# Patient Record
Sex: Female | Born: 1980 | Hispanic: Yes | Marital: Single | Smoking: Never smoker
Health system: Southern US, Community
[De-identification: ages and names within clinical notes are randomized; demographics above are authoritative.]

## PROBLEM LIST (undated history)

## (undated) DIAGNOSIS — F32A Depression, unspecified: Secondary | ICD-10-CM

## (undated) DIAGNOSIS — D649 Anemia, unspecified: Secondary | ICD-10-CM

## (undated) DIAGNOSIS — F909 Attention-deficit hyperactivity disorder, unspecified type: Secondary | ICD-10-CM

## (undated) DIAGNOSIS — N200 Calculus of kidney: Secondary | ICD-10-CM

## (undated) HISTORY — PX: ABDOMINAL HYSTERECTOMY: SHX81

---

## 2015-02-27 ENCOUNTER — Emergency Department (HOSPITAL_BASED_OUTPATIENT_CLINIC_OR_DEPARTMENT_OTHER): Payer: Medicaid Other

## 2015-02-27 ENCOUNTER — Emergency Department (HOSPITAL_BASED_OUTPATIENT_CLINIC_OR_DEPARTMENT_OTHER)
Admission: EM | Admit: 2015-02-27 | Discharge: 2015-02-27 | Disposition: A | Discharge status: Home / Self Care | Payer: Medicaid Other | Attending: Emergency Medicine | Admitting: Emergency Medicine

## 2015-02-27 ENCOUNTER — Encounter (HOSPITAL_BASED_OUTPATIENT_CLINIC_OR_DEPARTMENT_OTHER): Payer: Self-pay | Admitting: *Deleted

## 2015-02-27 DIAGNOSIS — W1839XA Other fall on same level, initial encounter: Secondary | ICD-10-CM | POA: Diagnosis not present

## 2015-02-27 DIAGNOSIS — Y998 Other external cause status: Secondary | ICD-10-CM | POA: Insufficient documentation

## 2015-02-27 DIAGNOSIS — Y9301 Activity, walking, marching and hiking: Secondary | ICD-10-CM | POA: Diagnosis not present

## 2015-02-27 DIAGNOSIS — S80211A Abrasion, right knee, initial encounter: Secondary | ICD-10-CM | POA: Diagnosis not present

## 2015-02-27 DIAGNOSIS — S99911A Unspecified injury of right ankle, initial encounter: Secondary | ICD-10-CM | POA: Diagnosis present

## 2015-02-27 DIAGNOSIS — S8261XA Displaced fracture of lateral malleolus of right fibula, initial encounter for closed fracture: Secondary | ICD-10-CM | POA: Insufficient documentation

## 2015-02-27 DIAGNOSIS — Z87448 Personal history of other diseases of urinary system: Secondary | ICD-10-CM | POA: Diagnosis not present

## 2015-02-27 DIAGNOSIS — Y929 Unspecified place or not applicable: Secondary | ICD-10-CM | POA: Diagnosis not present

## 2015-02-27 DIAGNOSIS — S82891A Other fracture of right lower leg, initial encounter for closed fracture: Secondary | ICD-10-CM

## 2015-02-27 HISTORY — DX: Calculus of kidney: N20.0

## 2015-02-27 MED ORDER — HYDROCODONE-ACETAMINOPHEN 5-325 MG PO TABS
2.0000 | ORAL_TABLET | Freq: Once | ORAL | Status: DC
  Filled 2015-02-27: qty 2

## 2015-02-27 MED ORDER — HYDROCODONE-ACETAMINOPHEN 5-325 MG PO TABS: 1.0000 | ORAL_TABLET | ORAL | Status: DC | PRN

## 2015-02-27 NOTE — ED Provider Notes (Signed)
CSN: [id number]     Arrival date & time 02/27/15  1324 History   First MD Initiated Contact with Patient 02/27/15 1001     Chief Complaint  Patient presents with  . Ankle Pain     (Consider location/radiation/quality/duration/timing/severity/associated sxs/prior Treatment) HPI Complains of right ankle pain worse at  lateral malleolus onset 1 AM today after she forcibly inverted her right ankle and fell while walking 1 AM today no other injury no treatment prior to coming here pain is worse with weightbearing improved with nonweightbearing. No other associated symptoms Past Medical History  Diagnosis Date  . Kidney stones    Past Surgical History  Procedure Laterality Date  . Abdominal hysterectomy     No family history on file. History  Substance Use Topics  . Smoking status: Not on file  . Smokeless tobacco: Not on file  . Alcohol Use: No   occasional alcohol no drugs nonsmoker OB History    No data available     Review of Systems  Constitutional: Negative.   Musculoskeletal: Positive for arthralgias and gait problem.       Pain with weightbearing on right lower extremity. Right ankle pain  Skin: Positive for wound.       Abrasion to right knee  Allergic/Immunologic: Negative.        Up to date on tetanus immunization      Allergies  Review of patient's allergies indicates no known allergies.  Home Medications   Prior to Admission medications   Medication Sig Start Date End Date Taking? Authorizing Provider  tamsulosin (FLOMAX) 0.4 MG CAPS capsule Take 0.4 mg by mouth.   Yes Historical Provider, MD   BP 126/89 mmHg  Pulse 95  Temp(Src) 98.4 F (36.9 C) (Oral)  Resp 16  Ht  (1.549 m)  Wt 180 lb (81.647 kg)  BMI 34.03 kg/m2  SpO2 99% Physical Exam  Constitutional: She appears well-developed and well-nourished. No distress.  HENT:  Head: Normocephalic and atraumatic.  Eyes: EOM are normal.  Neck: Neck supple.  Cardiovascular: Normal rate.    Pulmonary/Chest: Effort normal.  Abdominal: She exhibits no distension.  Musculoskeletal: She exhibits no edema.  Right lower extremity dime-sized abrasion overlying patella. Ankle is swollen and tender over lateral malleolus. Skin intact. DP pulse 2+. Negative Thompson's test. She is nontender over proximal fibula. All other extremity is a contusion abrasion or tenderness neurovascularly intact  Neurological: She is alert. No cranial nerve deficit. Coordination normal.  Skin: Skin is warm and dry. No rash noted.  Psychiatric: She has a normal mood and affect.  Nursing note and vitals reviewed.   ED Course  Procedures (including critical care time) Labs Review Labs Reviewed - No data to display  Imaging Review No results found.   EKG Interpretation None     X-ray viewed by me. No results found for this or any previous visit. Dg Ankle Complete Right  02/27/2015   CLINICAL DATA:  34 year old female with history of trauma from a fall complaining of right ankle pain.  EXAM: RIGHT ANKLE - COMPLETE 3+ VIEW  COMPARISON:  No priors.  FINDINGS: Three views of the right ankle demonstrate an oblique fracture through the lateral malleolus, which appears minimally displaced (approximately 2 mm posteriorly). Overlying soft tissue swelling. Distal tibia appears intact. Mild widening of the lateral ankle mortise.  IMPRESSION: 1. Acute minimally displaced oblique fracture of the lateral malleolus with mild widening of the lateral ankle mortise, as above.   Electronically Signed  By: Trudie Reed M.D.   On: 02/27/2015 10:33    Posterior short-leg splint applied by technician iscomfortable for patient.  MDM  Plan prescription Norco, crutches. Orthopedic referral Dr.Norris Diagnosis closed fracture of right ankle Final diagnoses:  None        Doug Sou, MD 02/27/15 1102

## 2015-02-27 NOTE — ED Notes (Signed)
Patient transported to X-ray 

## 2015-02-27 NOTE — Discharge Instructions (Signed)
Ankle Fracture Take Tylenol for mild pain or the pain medicine prescribed for bad pain. Don't take Tylenol or get it with the pain medicine prescribed as the combination can be dangerous. Call Dr.Norris's office in 2 days to schedule an appointment for within the next week. Tell office staff that you were seen here and had x-rays performed here. A fracture is a break in a bone. A cast or splint may be used to protect the ankle and heal the break. Sometimes, surgery is needed. HOME CARE  Use crutches as told by your doctor. It is very important that you use your crutches correctly.  Do not put weight or pressure on the injured ankle until told by your doctor.  Keep your ankle raised (elevated) when sitting or lying down.  Apply ice to the ankle:  Put ice in a plastic bag.  Place a towel between your cast and the bag.  Leave the ice on for 20 minutes, 2-3 times a day.  If you have a plaster or fiberglass cast:  Do not try to scratch under the cast with any objects.  Check the skin around the cast every day. You may put lotion on red or sore areas.  Keep your cast dry and clean.  If you have a plaster splint:  Wear the splint as told by your doctor.  You can loosen the elastic around the splint if your toes get numb, tingle, or turn cold or blue.  Do not put pressure on any part of your cast or splint. It may break. Rest your plaster splint or cast only on a pillow the first 24 hours until it is fully hardened.  Cover your cast or splint with a plastic bag during showers.  Do not lower your cast or splint into water.  Take medicine as told by your doctor.  Do not drive until your doctor says it is safe.  Follow-up with your doctor as told. It is very important that you go to your follow-up visits. GET HELP IF: The swelling and discomfort gets worse.  GET HELP RIGHT AWAY IF:   Your splint or cast breaks.  You continue to have very bad pain.  You have new pain or  swelling after your splint or cast was put on.  Your skin or toes below the injured ankle:  Turn blue or gray.  Feel cold, numb, or you cannot feel them.  There is a bad smell or yellowish white fluid (pus) coming from under the splint or cast. MAKE SURE YOU:   Understand these instructions.  Will watch your condition.  Will get help right away if you are not doing well or get worse. Document Released: 05/07/2009 Document Revised: 04/30/2013 Document Reviewed: 02/06/2013 [facility] Patient Information 2015 [place], [place]. This information is not intended to replace advice given to you by your health care provider. Make sure you discuss any questions you have with your health care provider.

## 2015-02-27 NOTE — ED Notes (Signed)
Pt c/o right ankle injury x 10 hrs ago, fall while walking

## 2015-02-27 NOTE — ED Notes (Signed)
MD at bedside. 

## 2015-03-11 ENCOUNTER — Encounter (HOSPITAL_COMMUNITY): Payer: Self-pay | Admitting: *Deleted

## 2015-03-11 MED ORDER — CHLORHEXIDINE GLUCONATE 4 % EX LIQD: 60.0000 mL | Freq: Once | CUTANEOUS | Status: DC

## 2015-03-11 MED ORDER — MUPIROCIN 2 % EX OINT
1.0000 "application " | TOPICAL_OINTMENT | Freq: Once | CUTANEOUS | Status: AC
  Administered 2015-03-12: 1 via TOPICAL
  Filled 2015-03-11: qty 22

## 2015-03-11 MED ORDER — CEFAZOLIN SODIUM-DEXTROSE 2-3 GM-% IV SOLR
2.0000 g | INTRAVENOUS | Status: AC
  Administered 2015-03-12: 2 g via INTRAVENOUS
  Filled 2015-03-11 (×2): qty 50

## 2015-03-11 NOTE — H&P (Signed)
  Dawn Morton is an 34 y.o. female.    Chief Complaint: right ankle pain  HPI: Pt is a 34 y.o. female complaining of right ankle pain since an injury about 2 weeks ago. Pt slipped and fell while dancing injuring right ankle. Seen in the office for cast placement but unable to tolerate the cast. Pain had continually increased since the beginning. X-rays in the clinic show right fibula fracture with mild displacement. Various options are discussed with the patient. Risks, benefits and expectations were discussed with the patient. Patient understand the risks, benefits and expectations and wishes to proceed with surgery.   PCP:  No primary care provider on file.  D/C Plans: Home  PMH: No past medical history on file.  PSH: No past surgical history on file.  Social History:  has no tobacco, alcohol, and drug history on file.  Allergies:  Allergies not on file  Medications: No current facility-administered medications for this encounter.   No current outpatient prescriptions on file.    No results found for this or any previous visit (from the past 48 hour(s)). No results found.  ROS: Pain with rom of the right lower extremity  Physical Exam:  Alert and oriented 34 y.o. female in no acute distress Cranial nerves 2-12 intact Cervical spine: full rom with no tenderness, nv intact distally Chest: active breath sounds bilaterally, no wheeze rhonchi or rales Heart: regular rate and rhythm, no murmur Abd: non tender non distended with active bowel sounds Hip is stable with rom  Right ankle mild edema nv intact distally Pain with rom and pt keeps it plantar flexed  Assessment/Plan Assessment: right fibula fracture  Plan: Patient will undergo a right ankle ORIF by Dr. Ranell Patrick at [facility]. Risks benefits and expectations were discussed with the patient. Patient understand risks, benefits and expectations and wishes to proceed.

## 2015-03-12 ENCOUNTER — Encounter (HOSPITAL_COMMUNITY)
Admission: RE | Disposition: A | Discharge status: Home / Self Care | Payer: Self-pay | Source: Ambulatory Visit | Attending: Orthopedic Surgery

## 2015-03-12 ENCOUNTER — Ambulatory Visit (HOSPITAL_COMMUNITY): Payer: Medicaid Other | Admitting: Anesthesiology

## 2015-03-12 ENCOUNTER — Ambulatory Visit (HOSPITAL_COMMUNITY)
Admission: RE | Admit: 2015-03-12 | Discharge: 2015-03-12 | Disposition: A | Discharge status: Home / Self Care | Payer: Medicaid Other | Source: Ambulatory Visit | Attending: Orthopedic Surgery | Admitting: Orthopedic Surgery

## 2015-03-12 ENCOUNTER — Encounter (HOSPITAL_COMMUNITY): Payer: Self-pay | Admitting: *Deleted

## 2015-03-12 DIAGNOSIS — S82831A Other fracture of upper and lower end of right fibula, initial encounter for closed fracture: Secondary | ICD-10-CM | POA: Insufficient documentation

## 2015-03-12 DIAGNOSIS — Y9341 Activity, dancing: Secondary | ICD-10-CM | POA: Diagnosis not present

## 2015-03-12 DIAGNOSIS — W010XXA Fall on same level from slipping, tripping and stumbling without subsequent striking against object, initial encounter: Secondary | ICD-10-CM | POA: Diagnosis not present

## 2015-03-12 HISTORY — PX: ORIF FIBULA FRACTURE: SHX5114

## 2015-03-12 HISTORY — DX: Anemia, unspecified: D64.9

## 2015-03-12 LAB — CBC
HEMATOCRIT: 37.8 % (ref 36.0–46.0)
Hemoglobin: 13.1 g/dL (ref 12.0–15.0)
MCH: 30.5 pg (ref 26.0–34.0)
MCHC: 34.7 g/dL (ref 30.0–36.0)
MCV: 87.9 fL (ref 78.0–100.0)
Platelets: 352 10*3/uL (ref 150–400)
RBC: 4.3 MIL/uL (ref 3.87–5.11)
RDW: 12.6 % (ref 11.5–15.5)
WBC: 6.6 10*3/uL (ref 4.0–10.5)

## 2015-03-12 LAB — SURGICAL PCR SCREEN
MRSA, PCR: NEGATIVE
STAPHYLOCOCCUS AUREUS: POSITIVE — AB

## 2015-03-12 SURGERY — OPEN REDUCTION INTERNAL FIXATION (ORIF) FIBULA FRACTURE
Anesthesia: Regional | Site: Ankle | Laterality: Right

## 2015-03-12 MED ORDER — OXYCODONE-ACETAMINOPHEN 5-325 MG PO TABS: 1.0000 | ORAL_TABLET | ORAL | Status: DC | PRN

## 2015-03-12 MED ORDER — LIDOCAINE HCL (CARDIAC) 20 MG/ML IV SOLN
INTRAVENOUS | Status: DC | PRN
  Administered 2015-03-12: 50 mg via INTRAVENOUS

## 2015-03-12 MED ORDER — HYDROMORPHONE HCL 1 MG/ML IJ SOLN: 0.2500 mg | INTRAMUSCULAR | Status: DC | PRN

## 2015-03-12 MED ORDER — FENTANYL CITRATE (PF) 100 MCG/2ML IJ SOLN
100.0000 ug | Freq: Once | INTRAMUSCULAR | Status: AC
  Administered 2015-03-12: 50 ug via INTRAVENOUS

## 2015-03-12 MED ORDER — MIDAZOLAM HCL 2 MG/2ML IJ SOLN
INTRAMUSCULAR | Status: AC
  Administered 2015-03-12: 2 mg via INTRAVENOUS
  Filled 2015-03-12: qty 2

## 2015-03-12 MED ORDER — ONDANSETRON HCL 4 MG/2ML IJ SOLN
INTRAMUSCULAR | Status: DC | PRN
  Administered 2015-03-12: 4 mg via INTRAVENOUS

## 2015-03-12 MED ORDER — OXYCODONE HCL 5 MG/5ML PO SOLN: 5.0000 mg | Freq: Once | ORAL | Status: DC | PRN

## 2015-03-12 MED ORDER — FENTANYL CITRATE (PF) 100 MCG/2ML IJ SOLN
INTRAMUSCULAR | Status: DC | PRN
  Administered 2015-03-12: 100 ug via INTRAVENOUS

## 2015-03-12 MED ORDER — 0.9 % SODIUM CHLORIDE (POUR BTL) OPTIME
TOPICAL | Status: DC | PRN
  Administered 2015-03-12: 1000 mL

## 2015-03-12 MED ORDER — FENTANYL CITRATE (PF) 250 MCG/5ML IJ SOLN
INTRAMUSCULAR | Status: AC
Start: 2015-03-12 — End: 2015-03-12
  Filled 2015-03-12: qty 5

## 2015-03-12 MED ORDER — FENTANYL CITRATE (PF) 100 MCG/2ML IJ SOLN
INTRAMUSCULAR | Status: AC
  Administered 2015-03-12: 50 ug via INTRAVENOUS
  Filled 2015-03-12: qty 2

## 2015-03-12 MED ORDER — OXYCODONE HCL 5 MG PO TABS: 5.0000 mg | ORAL_TABLET | Freq: Once | ORAL | Status: DC | PRN

## 2015-03-12 MED ORDER — KETOROLAC TROMETHAMINE 30 MG/ML IJ SOLN
INTRAMUSCULAR | Status: DC | PRN
  Administered 2015-03-12: 30 mg via INTRAVENOUS

## 2015-03-12 MED ORDER — PROPOFOL 10 MG/ML IV BOLUS
INTRAVENOUS | Status: DC | PRN
  Administered 2015-03-12: 140 mg via INTRAVENOUS
  Administered 2015-03-12: 60 mg via INTRAVENOUS

## 2015-03-12 MED ORDER — MIDAZOLAM HCL 2 MG/2ML IJ SOLN
2.0000 mg | Freq: Once | INTRAMUSCULAR | Status: AC
  Administered 2015-03-12: 2 mg via INTRAVENOUS

## 2015-03-12 MED ORDER — LACTATED RINGERS IV SOLN
INTRAVENOUS | Status: DC
  Administered 2015-03-12: 14:00:00 via INTRAVENOUS

## 2015-03-12 MED ORDER — PHENYLEPHRINE HCL 10 MG/ML IJ SOLN
INTRAMUSCULAR | Status: DC | PRN
  Administered 2015-03-12 (×4): 40 ug via INTRAVENOUS

## 2015-03-12 MED ORDER — BUPIVACAINE-EPINEPHRINE (PF) 0.5% -1:200000 IJ SOLN
INTRAMUSCULAR | Status: DC | PRN
  Administered 2015-03-12: 25 mL via PERINEURAL

## 2015-03-12 MED ORDER — DEXAMETHASONE SODIUM PHOSPHATE 4 MG/ML IJ SOLN
INTRAMUSCULAR | Status: DC | PRN
  Administered 2015-03-12: 8 mg via INTRAVENOUS

## 2015-03-12 MED ORDER — PROMETHAZINE HCL 25 MG/ML IJ SOLN
INTRAMUSCULAR | Status: AC
  Administered 2015-03-12: 6.25 mg via INTRAVENOUS
  Filled 2015-03-12: qty 1

## 2015-03-12 MED ORDER — METHOCARBAMOL 500 MG PO TABS: 500.0000 mg | ORAL_TABLET | Freq: Three times a day (TID) | ORAL | Status: DC | PRN

## 2015-03-12 MED ORDER — HYDROMORPHONE HCL 2 MG PO TABS: 2.0000 mg | ORAL_TABLET | ORAL | Status: DC | PRN

## 2015-03-12 MED ORDER — PROMETHAZINE HCL 25 MG/ML IJ SOLN
6.2500 mg | INTRAMUSCULAR | Status: DC | PRN
  Administered 2015-03-12: 6.25 mg via INTRAVENOUS

## 2015-03-12 MED ORDER — KETOROLAC TROMETHAMINE 30 MG/ML IJ SOLN: 30.0000 mg | Freq: Once | INTRAMUSCULAR | Status: DC

## 2015-03-12 MED ORDER — PROPOFOL 10 MG/ML IV BOLUS
INTRAVENOUS | Status: AC
  Filled 2015-03-12: qty 20

## 2015-03-12 MED ORDER — MIDAZOLAM HCL 2 MG/2ML IJ SOLN
INTRAMUSCULAR | Status: AC
  Filled 2015-03-12: qty 4

## 2015-03-12 SURGICAL SUPPLY — 66 items
BANDAGE ELASTIC 4 VELCRO ST LF (GAUZE/BANDAGES/DRESSINGS) ×3 IMPLANT
BANDAGE ELASTIC 6 VELCRO ST LF (GAUZE/BANDAGES/DRESSINGS) ×6 IMPLANT
BANDAGE ESMARK 6X9 LF (GAUZE/BANDAGES/DRESSINGS) ×1 IMPLANT
BIT DRILL 2.5X2.75 QC CALB (BIT) ×3 IMPLANT
BIT DRILL 3.5X5.5 QC CALB (BIT) ×3 IMPLANT
BNDG ESMARK 6X9 LF (GAUZE/BANDAGES/DRESSINGS) ×3
BNDG GAUZE ELAST 4 BULKY (GAUZE/BANDAGES/DRESSINGS) ×3 IMPLANT
CANISTER SUCTION 2500CC (MISCELLANEOUS) ×3 IMPLANT
CUFF TOURNIQUET SINGLE 34IN LL (TOURNIQUET CUFF) IMPLANT
CUFF TOURNIQUET SINGLE 44IN (TOURNIQUET CUFF) IMPLANT
DECANTER SPIKE VIAL GLASS SM (MISCELLANEOUS) ×3 IMPLANT
DRAPE OEC MINIVIEW 54X84 (DRAPES) ×3 IMPLANT
DRAPE U-SHAPE 47X51 STRL (DRAPES) ×3 IMPLANT
DRSG ADAPTIC 3X8 NADH LF (GAUZE/BANDAGES/DRESSINGS) ×3 IMPLANT
DRSG PAD ABDOMINAL 8X10 ST (GAUZE/BANDAGES/DRESSINGS) ×3 IMPLANT
DURAPREP 26ML APPLICATOR (WOUND CARE) ×3 IMPLANT
ELECT REM PT RETURN 9FT ADLT (ELECTROSURGICAL) ×3
ELECTRODE REM PT RTRN 9FT ADLT (ELECTROSURGICAL) ×1 IMPLANT
FACESHIELD WRAPAROUND (MASK) IMPLANT
GAUZE SPONGE 4X4 12PLY STRL (GAUZE/BANDAGES/DRESSINGS) ×3 IMPLANT
GLOVE BIOGEL PI ORTHO PRO 7.5 (GLOVE) ×2
GLOVE BIOGEL PI ORTHO PRO SZ8 (GLOVE) ×2
GLOVE ORTHO TXT STRL SZ7.5 (GLOVE) ×3 IMPLANT
GLOVE PI ORTHO PRO STRL 7.5 (GLOVE) ×1 IMPLANT
GLOVE PI ORTHO PRO STRL SZ8 (GLOVE) ×1 IMPLANT
GLOVE SURG ORTHO 8.5 STRL (GLOVE) ×3 IMPLANT
GOWN STRL REUS W/ TWL LRG LVL3 (GOWN DISPOSABLE) ×3 IMPLANT
GOWN STRL REUS W/ TWL XL LVL3 (GOWN DISPOSABLE) ×2 IMPLANT
GOWN STRL REUS W/TWL LRG LVL3 (GOWN DISPOSABLE) ×6
GOWN STRL REUS W/TWL XL LVL3 (GOWN DISPOSABLE) ×4
KIT BASIN OR (CUSTOM PROCEDURE TRAY) ×3 IMPLANT
KIT ROOM TURNOVER OR (KITS) ×3 IMPLANT
MANIFOLD NEPTUNE II (INSTRUMENTS) IMPLANT
NS IRRIG 1000ML POUR BTL (IV SOLUTION) ×3 IMPLANT
PACK ORTHO EXTREMITY (CUSTOM PROCEDURE TRAY) ×3 IMPLANT
PAD ARMBOARD 7.5X6 YLW CONV (MISCELLANEOUS) ×6 IMPLANT
PAD CAST 4YDX4 CTTN HI CHSV (CAST SUPPLIES) ×1 IMPLANT
PADDING CAST COTTON 4X4 STRL (CAST SUPPLIES) ×2
PADDING CAST COTTON 6X4 STRL (CAST SUPPLIES) ×3 IMPLANT
PLATE 6H 3.5 143556 (Plate) ×3 IMPLANT
SCREW CORTICAL 3.5MM  12MM (Screw) ×2 IMPLANT
SCREW CORTICAL 3.5MM 12MM (Screw) ×1 IMPLANT
SCREW CORTICAL 3.5MM 14MM (Screw) ×9 IMPLANT
SCREW CORTICAL 3.5MM 26MM (Screw) ×3 IMPLANT
SCREW NLOCK CANC HEX 4X14 (Screw) ×3 IMPLANT
SCREW NLOCK CANC HEX 4X18 (Screw) ×2 IMPLANT
SCREW NLOCK CANC HEX 4X18 FIB (Screw) ×1 IMPLANT
SPLINT PLASTER CAST XFAST 5X30 (CAST SUPPLIES) ×1 IMPLANT
SPLINT PLASTER XFAST SET 5X30 (CAST SUPPLIES) ×2
SPONGE LAP 18X18 X RAY DECT (DISPOSABLE) ×3 IMPLANT
SPONGE LAP 4X18 X RAY DECT (DISPOSABLE) ×9 IMPLANT
STAPLER VISISTAT (STAPLE) ×3 IMPLANT
STAPLER VISISTAT 35W (STAPLE) ×6 IMPLANT
SUCTION FRAZIER TIP 10 FR DISP (SUCTIONS) ×3 IMPLANT
SUT ETHILON 3 0 FSL (SUTURE) IMPLANT
SUT VIC AB 0 CT1 27 (SUTURE) ×2
SUT VIC AB 0 CT1 27XBRD ANBCTR (SUTURE) ×1 IMPLANT
SUT VIC AB 2-0 CT1 27 (SUTURE) ×2
SUT VIC AB 2-0 CT1 TAPERPNT 27 (SUTURE) ×1 IMPLANT
SUT VIC AB 3-0 CT1 27 (SUTURE)
SUT VIC AB 3-0 CT1 TAPERPNT 27 (SUTURE) IMPLANT
TOWEL OR 17X24 6PK STRL BLUE (TOWEL DISPOSABLE) ×3 IMPLANT
TOWEL OR 17X26 10 PK STRL BLUE (TOWEL DISPOSABLE) ×3 IMPLANT
TUBE CONNECTING 12'X1/4 (SUCTIONS) ×1
TUBE CONNECTING 12X1/4 (SUCTIONS) ×2 IMPLANT
WATER STERILE IRR 1000ML POUR (IV SOLUTION) IMPLANT

## 2015-03-12 NOTE — Transfer of Care (Signed)
Immediate Anesthesia Transfer of Care Note  Patient: Dawn Morton  Procedure(s) Performed: Procedure(s): OPEN REDUCTION INTERNAL FIXATION (ORIF) RIGHT FIBULA FRACTURE (Right)  Patient Location: PACU  Anesthesia Type:General and Regional  Level of Consciousness: awake, alert  and oriented  Airway & Oxygen Therapy: Patient Spontanous Breathing and Patient connected to face mask oxygen  Post-op Assessment: Report given to RN and Post -op Vital signs reviewed and stable  Post vital signs: Reviewed and stable  Last Vitals:  Filed Vitals:   03/12/15 1702  BP:   Pulse:   Temp: 36.9 C  Resp:     Complications: No apparent anesthesia complications

## 2015-03-12 NOTE — Anesthesia Preprocedure Evaluation (Addendum)
Anesthesia Evaluation  Patient identified by MRN, date of birth, ID band Patient awake    Reviewed: Allergy & Precautions, NPO status , Patient's Chart, lab work & pertinent test results  Airway Mallampati: II  TM Distance: >3 FB Neck ROM: Full    Dental  (+) Teeth Intact, Dental Advisory Given   Pulmonary neg pulmonary ROS,  breath sounds clear to auscultation        Cardiovascular negative cardio ROS  Rhythm:Regular Rate:Normal     Neuro/Psych negative neurological ROS     GI/Hepatic negative GI ROS, Neg liver ROS,   Endo/Other    Renal/GU negative Renal ROS     Musculoskeletal negative musculoskeletal ROS (+)   Abdominal   Peds  Hematology negative hematology ROS (+)   Anesthesia Other Findings   Reproductive/Obstetrics                            Anesthesia Physical Anesthesia Plan  ASA: II  Anesthesia Plan: General and Regional   Post-op Pain Management: GA combined w/ Regional for post-op pain   Induction: Intravenous  Airway Management Planned: LMA  Additional Equipment:   Intra-op Plan:   Post-operative Plan:   Informed Consent: I have reviewed the patients History and Physical, chart, labs and discussed the procedure including the risks, benefits and alternatives for the proposed anesthesia with the patient or authorized representative who has indicated his/her understanding and acceptance.   Dental advisory given  Plan Discussed with: CRNA  Anesthesia Plan Comments:         Anesthesia Quick Evaluation

## 2015-03-12 NOTE — Interval H&P Note (Signed)
History and Physical Interval Note:  03/12/2015 3:05 PM  Dawn Morton  has presented today for surgery, with the diagnosis of RIGHT FIBULAR FRACTURE   The various methods of treatment have been discussed with the patient and family. After consideration of risks, benefits and other options for treatment, the patient has consented to  Procedure(s): OPEN REDUCTION INTERNAL FIXATION (ORIF) RIGHT FIBULA FRACTURE (Right) as a surgical intervention .  The patient's history has been reviewed, patient examined, no change in status, stable for surgery.  I have reviewed the patient's chart and labs.  Questions were answered to the patient's satisfaction.     Dawn Morton,STEVEN R

## 2015-03-12 NOTE — Discharge Instructions (Signed)
Non weight bearing on the right ankle, use walker or crutches  Keep the right leg elevated at all times, wiggle the toes  Do not let the splint get wet  Follow up with Dr Ranell Patrick in 10-14 days in the office  Call [phone]  Take one full aspirin  per day to reduce risk of blood clots

## 2015-03-12 NOTE — Anesthesia Procedure Notes (Addendum)
Anesthesia Regional Block:  Popliteal block  Pre-Anesthetic Checklist: ,, timeout performed, Correct Patient, Correct Site, Correct Laterality, Correct Procedure, Correct Position, site marked, Risks and benefits discussed,  Surgical consent,  Pre-op evaluation,  At surgeon's request and post-op pain management  Laterality: Right  Prep: chloraprep       Needles:  Injection technique: Single-shot  Needle Type: Echogenic Needle     Needle Length: 9cm 9 cm Needle Gauge: 21 and 21 G    Additional Needles:  Procedures: ultrasound guided (picture in chart) Popliteal block Narrative:  Start time: 03/12/2015 2:28 PM End time: 03/12/2015 2:36 PM Injection made incrementally with aspirations every 5 mL.  Performed by: Personally  Anesthesiologist: Marcene Duos  Additional Notes: Risks and benefits explained. Pt tolerated well with no immediate complications.   Procedure Name: LMA Insertion Date/Time: 03/12/2015 3:39 PM Performed by: Marylyn Ishihara Pre-anesthesia Checklist: Patient identified, Timeout performed, Emergency Drugs available, Suction available and Patient being monitored Patient Re-evaluated:Patient Re-evaluated prior to inductionOxygen Delivery Method: Circle system utilized Preoxygenation: Pre-oxygenation with 100% oxygen Intubation Type: IV induction Ventilation: Mask ventilation without difficulty LMA: LMA inserted LMA Size: 4.0 Number of attempts: 1 Placement Confirmation: positive ETCO2 and breath sounds checked- equal and bilateral Tube secured with: Tape Dental Injury: Teeth and Oropharynx as per pre-operative assessment

## 2015-03-12 NOTE — Brief Op Note (Signed)
03/12/2015  5:13 PM  PATIENT:  Lamar Benes  34 y.o. female  PRE-OPERATIVE DIAGNOSIS:  RIGHT FIBULAR FRACTURE   POST-OPERATIVE DIAGNOSIS:  RIGHT FIBULAR FRACTURE, DISPLACED  PROCEDURE:  Procedure(s): OPEN REDUCTION INTERNAL FIXATION (ORIF) RIGHT FIBULA FRACTURE (Right)   SURGEON:  Surgeon(s) and Role:    * Beverely Low, MD - Primary  PHYSICIAN ASSISTANT:   ASSISTANTS: Thea Gist, PA-C   ANESTHESIA:   regional and general  EBL:  Total I/O In: 700 [I.V.:700] Out: -   BLOOD ADMINISTERED:none  DRAINS: none   LOCAL MEDICATIONS USED:  NONE  SPECIMEN:  No Specimen  DISPOSITION OF SPECIMEN:  N/A  COUNTS:  YES  TOURNIQUET:   Total Tourniquet Time Documented: Thigh (Right) - 50 minutes Total: Thigh (Right) - 50 minutes   DICTATION: .Other Dictation: Dictation Number [id number]  PLAN OF CARE: Discharge to home after PACU  PATIENT DISPOSITION:  PACU - hemodynamically stable.   Delay start of Pharmacological VTE agent (>24hrs) due to surgical blood loss or risk of bleeding: not applicable

## 2015-03-12 NOTE — Anesthesia Postprocedure Evaluation (Signed)
  Anesthesia Post-op Note  Patient: Dawn Morton  Procedure(s) Performed: Procedure(s): OPEN REDUCTION INTERNAL FIXATION (ORIF) RIGHT FIBULA FRACTURE (Right)  Patient Location: PACU  Anesthesia Type:GA combined with regional for post-op pain  Level of Consciousness: awake, alert , oriented and patient cooperative  Airway and Oxygen Therapy: Patient Spontanous Breathing  Post-op Pain: none  Post-op Assessment: Post-op Vital signs reviewed, Patient's Cardiovascular Status Stable, Respiratory Function Stable, Patent Airway, No signs of Nausea or vomiting and Pain level controlled     RLE Motor Response: Purposeful movement, Responds to commands RLE Sensation: Full sensation      Post-op Vital Signs: Reviewed and stable  Last Vitals:  Filed Vitals:   03/12/15 1800  BP: 112/67  Pulse: 73  Temp: 36.9 C  Resp: 14    Complications: No apparent anesthesia complications

## 2015-03-12 NOTE — Interval H&P Note (Signed)
History and Physical Interval Note:  03/12/2015 3:00 PM  Dawn Morton  has presented today for surgery, with the diagnosis of RIGHT FIBULAR FRACTURE   The various methods of treatment have been discussed with the patient and family. After consideration of risks, benefits and other options for treatment, the patient has consented to  Procedure(s): OPEN REDUCTION INTERNAL FIXATION (ORIF) RIGHT FIBULA FRACTURE (Right) as a surgical intervention .  The patient's history has been reviewed, patient examined, no change in status, stable for surgery.  I have reviewed the patient's chart and labs.  Questions were answered to the patient's satisfaction.     Nataniel Gasper,STEVEN R

## 2015-03-13 NOTE — Op Note (Signed)
Dawn Morton, Dawn Morton        ACCOUNT NO.:  [id number]  MEDICAL RECORD NO.:  [id number]  LOCATION:  [facility]                         FACILITY:  [facility]  PHYSICIAN:  Almedia Balls. Ranell Patrick, M.D. DATE OF BIRTH:  03/03/1981  DATE OF PROCEDURE:  03/12/2015 DATE OF DISCHARGE:  03/12/2015                              OPERATIVE REPORT   PREOPERATIVE DIAGNOSIS:  Displaced right ankle fracture, Weber B fibular fracture.  POSTOPERATIVE DIAGNOSIS:  Displaced right ankle fracture, Weber B fibular fracture.  PROCEDURE PERFORMED:  Open reduction and internal fixation of displaced right ankle fracture.  ATTENDING SURGEON:  Almedia Balls. Ranell Patrick, M.D.  ASSISTANT:  Konrad Felix Dixon, [place], who scrubbed the entire procedure and necessary for satisfactory completion of surgery.  ANESTHESIA:  General anesthesia was used plus popliteal block.  ESTIMATED BLOOD LOSS:  Minimal.  FLUID REPLACEMENT:  1000 mL crystalloid.  INSTRUMENT COUNTS:  Correct.  COMPLICATIONS:  There were no complications.  ANTIBIOTICS:  Perioperative antibiotics were given.  INDICATIONS:  The patient is a 34 year old female with history of a twisting injury to her right ankle.  The patient presented with a swollen, painful ankle, which she could not weight bear on.  X-rays demonstrating a displaced fibula fracture.  The patient has been seen and managed conservatively.  Due to the fracture was minimal shortening and minimal displacement, the patient was seen just this week in the clinic for 1-week followup.  The patient did have about a 2-mm gap, separation about 2 mm of short and we discussed options for management including continued conservative management versus surgical treatment. We discussed the relative merits of fixing this including restoring length, ensuring proper reduction of the ankle mortise, which was so critical for her being young and also to ability to bring her ankle up into neutral so as to avoid Achilles  contracture.  The patient elected to proceed with surgical management.  Risks and benefits were discussed. Informed consent obtained.  DESCRIPTION OF PROCEDURE:  After an adequate level of anesthesia achieved, the patient was positioned supine in the operating room table. Right leg was correctly identified.  Nonsterile tourniquet was placed on the right proximal thigh.  Right leg was prepped and draped in usual manner.  A bump had been utilized.  After time-out was called, we elevated the leg, exsanguinated using an Esmarch bandage, and elevated the tourniquet to 300 mmHg.  A longitudinal lateral incision of the distal fibula was created using a 10-blade, dissection down through the subcutaneous tissues using a 15-blade to the bone.  Subperiosteal dissection of the distal fibula performed.  Fracture site identified, this was a long Weber B fracture pattern at the level of the mortise. We went ahead and used the elevator and twisted that.  We were able to get the organizing hematoma, cleaned out the fracture site and some of the periosteum that had been turned into that fracture site.  Once we had the fracture cleaned, we were able to reduce it.  We did irrigate the joint over the front of the lateral gutter there and then, we were able to reduce the fracture with a CrabClaw clamp.  We then placed an interfragmentary screw, anterior proximal to distal posterior and gained good compression with a  22-mm 3.5 Biomet screw.  We then placed a 6-hole one-third tubular plate, Titanium on the lateral side of the fibula as a neutralization plate.  Full two holes distally with an 18 and 14-mm cancellous screws and then three bicortical 3.5 cortex screws proximal. We had excellent secure fixation.  Ankle mortise was completely reduced anatomically as visualized with AP and lateral multiplanar C-arm views. We irrigated the wound thoroughly and closed in layers with 2-0 Vicryl subcutaneous closure and  staples.  Sterile compressive bandage and short- leg splint were applied.  The patient tolerated the surgery well.     Almedia Balls. Ranell Patrick, M.D.     SRN/MEDQ  D:  03/12/2015  T:  03/13/2015  Job:  [id number]

## 2015-03-14 ENCOUNTER — Encounter (HOSPITAL_COMMUNITY): Payer: Self-pay | Admitting: Orthopedic Surgery

## 2015-03-16 ENCOUNTER — Encounter (HOSPITAL_COMMUNITY): Payer: Self-pay | Admitting: Orthopedic Surgery

## 2016-01-06 ENCOUNTER — Encounter (HOSPITAL_BASED_OUTPATIENT_CLINIC_OR_DEPARTMENT_OTHER): Payer: Self-pay | Admitting: Emergency Medicine

## 2016-01-06 ENCOUNTER — Other Ambulatory Visit: Payer: Self-pay

## 2016-01-06 ENCOUNTER — Emergency Department (HOSPITAL_BASED_OUTPATIENT_CLINIC_OR_DEPARTMENT_OTHER)
Admission: EM | Admit: 2016-01-06 | Discharge: 2016-01-07 | Disposition: A | Discharge status: Home / Self Care | Payer: Medicaid Other | Attending: Emergency Medicine | Admitting: Emergency Medicine

## 2016-01-06 DIAGNOSIS — M25512 Pain in left shoulder: Secondary | ICD-10-CM | POA: Diagnosis not present

## 2016-01-06 DIAGNOSIS — R200 Anesthesia of skin: Secondary | ICD-10-CM | POA: Diagnosis present

## 2016-01-06 DIAGNOSIS — M79609 Pain in unspecified limb: Secondary | ICD-10-CM

## 2016-01-06 DIAGNOSIS — R202 Paresthesia of skin: Secondary | ICD-10-CM

## 2016-01-06 MED ORDER — ORPHENADRINE CITRATE ER 100 MG PO TB12: 100.0000 mg | ORAL_TABLET | Freq: Two times a day (BID) | ORAL | Status: DC | PRN

## 2016-01-06 MED ORDER — NAPROXEN 500 MG PO TABS: 500.0000 mg | ORAL_TABLET | Freq: Two times a day (BID) | ORAL | Status: DC

## 2016-01-06 NOTE — Discharge Instructions (Signed)
Medications: Naprosyn, Norflex  Treatment: Take Naprosyn twice daily as prescribed. Take Norflex twice daily as needed for muscle pain and spasms. Do not drive or operate machinery when taking Norflex. Use moist heat 3-4 times daily on your shoulder alternating 20 minutes on, 20 minutes off. Try the shoulder range of motion exercises below as tolerated.  Follow-up: Please follow-up in and establish care with a primary care provider by calling the number circled on your discharge paperwork. Please return to emergency department if you develop any new or worsening symptoms, such as chest pain, shortness of breath, worsening pain or numbness, or inability to move arm.   Shoulder Range of Motion Exercises Shoulder range of motion (ROM) exercises are designed to keep the shoulder moving freely. They are often recommended for people who have shoulder pain. MOVEMENT EXERCISE When you are able, do this exercise 5-6 days per week, or as told by your health care provider. Work toward doing 2 sets of 10 swings. Pendulum Exercise How To Do This Exercise Lying Down 1. Lie face-down on a bed with your abdomen close to the side of the bed. 2. Let your arm hang over the side of the bed. 3. Relax your shoulder, arm, and hand. 4. Slowly and gently swing your arm forward and back. Do not use your neck muscles to swing your arm. They should be relaxed. If you are struggling to swing your arm, have someone gently swing it for you. When you do this exercise for the first time, swing your arm at a 15 degree angle for 15 seconds, or swing your arm 10 times. As pain lessens over time, increase the angle of the swing to 30-45 degrees. 5. Repeat steps 1-4 with the other arm. How To Do This Exercise While Standing 1. Stand next to a sturdy chair or table and hold on to it with your hand.  Bend forward at the waist.  Bend your knees slightly.  Relax your other arm and let it hang limp.  Relax the shoulder blade of the  arm that is hanging and let it drop.  While keeping your shoulder relaxed, use body motion to swing your arm in small circles. The first time you do this exercise, swing your arm for about 30 seconds or 10 times. When you do it next time, swing your arm for a little longer.  Stand up tall and relax.  Repeat steps 1-7, this time changing the direction of the circles. 2. Repeat steps 1-8 with the other arm. STRETCHING EXERCISES Do these exercises 3-4 times per day on 5-6 days per week or as told by your health care provider. Work toward holding the stretch for 20 seconds. Stretching Exercise 1 1. Lift your arm straight out in front of you. 2. Bend your arm 90 degrees at the elbow (right angle) so your forearm goes across your body and looks like the letter "L." 3. Use your other arm to gently pull the elbow forward and across your body. 4. Repeat steps 1-3 with the other arm. Stretching Exercise 2 You will need a towel or rope for this exercise. 1. Bend one arm behind your back with the palm facing outward. 2. Hold a towel with your other hand. 3. Reach the arm that holds the towel above your head, and bend that arm at the elbow. Your wrist should be behind your neck. 4. Use your free hand to grab the free end of the towel. 5. With the higher hand, gently pull the  towel up behind you. 6. With the lower hand, pull the towel down behind you. 7. Repeat steps 1-6 with the other arm. STRENGTHENING EXERCISES Do each of these exercises at four different times of day (sessions) every day or as told by your health care provider. To begin with, repeat each exercise 5 times (repetitions). Work toward doing 3 sets of 12 repetitions or as told by your health care provider. Strengthening Exercise 1 You will need a light weight for this activity. As you grow stronger, you may use a heavier weight. 1. Standing with a weight in your hand, lift your arm straight out to the side until it is at the same height  as your shoulder. 2. Bend your arm at 90 degrees so that your fingers are pointing to the ceiling. 3. Slowly raise your hand until your arm is straight up in the air. 4. Repeat steps 1-3 with the other arm. Strengthening Exercise 2 You will need a light weight for this activity. As you grow stronger, you may use a heavier weight. 1. Standing with a weight in your hand, gradually move your straight arm in an arc, starting at your side, then out in front of you, then straight up over your head. 2. Gradually move your other arm in an arc, starting at your side, then out in front of you, then straight up over your head. 3. Repeat steps 1-2 with the other arm. Strengthening Exercise 3 You will need an elastic band for this activity. As you grow stronger, gradually increase the size of the bands or increase the number of bands that you use at one time. 1. While standing, hold an elastic band in one hand and raise that arm up in the air. 2. With your other hand, pull down the band until that hand is by your side. 3. Repeat steps 1-2 with the other arm.   This information is not intended to replace advice given to you by your health care provider. Make sure you discuss any questions you have with your health care provider.   Document Released: 04/08/2003 Document Revised: 11/24/2014 Document Reviewed: 07/06/2014 Elsevier Interactive Patient Education [employer]

## 2016-01-06 NOTE — ED Provider Notes (Signed)
CSN: [id number]     Arrival date & time 01/06/16  1649 History   First MD Initiated Contact with Patient 01/06/16 1752     Chief Complaint  Patient presents with  . Numbness     (Consider location/radiation/quality/duration/timing/severity/associated sxs/prior Treatment) HPI Comments: Patient is a previously healthy 35 year old female who presents with a one-week history of left shoulder pain and arm pain. Patient has had associated paresthesias to her left thumb and index finger. Patient states she has had a feeling of heaviness and "bone getting poked with needles" sensation to her left forearm. Patient has tried an over-the-counter cream that temporary relieves the pain and ibuprofen. Patient has no history of injury or trauma to the area. 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 Patient uses her left hand a lot at work. Patient denies any neck swelling or heavy weight lifting. Patient denies any fevers, chest pain, shortness of breath, abdominal pain, vomiting, dysuria. Patient denies any recent surgeries or exogenous estrogen use.  The history is provided by the patient.    Past Medical History  Diagnosis Date  . Kidney stones    Past Surgical History  Procedure Laterality Date  . Abdominal hysterectomy     No family history on file. Social History  Substance Use Topics  . Smoking status: Never Smoker   . Smokeless tobacco: None  . Alcohol Use: No   OB History    No data available     Review of Systems  Constitutional: Negative for fever and chills.  HENT: Negative for facial swelling and sore throat.   Respiratory: Negative for shortness of breath.   Cardiovascular: Negative for chest pain.  Gastrointestinal: Positive for nausea (occasionally when she has not eaten, chronic). Negative for vomiting and abdominal pain.  Genitourinary: Negative for dysuria.  Musculoskeletal: Positive for myalgias and arthralgias. Negative for back pain.  Skin: Negative for rash and  wound.  Neurological: Negative for headaches.  Psychiatric/Behavioral: The patient is not nervous/anxious.       Allergies  Review of patient's allergies indicates no known allergies.  Home Medications   Prior to Admission medications   Medication Sig Start Date End Date Taking? Authorizing Provider  naproxen (NAPROSYN) 500 MG tablet Take 1 tablet (500 mg total) by mouth 2 (two) times daily. 01/06/16   Emi HolesAlexandra M Kishan Wachsmuth, PA-C  orphenadrine (NORFLEX) 100 MG tablet Take 1 tablet (100 mg total) by mouth 2 (two) times daily as needed for muscle spasms. 01/06/16   Emi HolesAlexandra M Trenace Coughlin, PA-C  tamsulosin (FLOMAX) 0.4 MG CAPS capsule Take 0.4 mg by mouth.    Historical Provider, MD   BP 127/77 mmHg  Pulse 85  Temp(Src) 98.6 F (37 C) (Oral)  Resp 18  Ht 5\' 1"  (1.549 m)  Wt 83.915 kg  BMI 34.97 kg/m2  SpO2 98% Physical Exam  Constitutional: She appears well-developed and well-nourished. No distress.  HENT:  Head: Normocephalic and atraumatic.  Mouth/Throat: Oropharynx is clear and moist. No oropharyngeal exudate.  Eyes: Conjunctivae are normal. Pupils are equal, round, and reactive to light. Right eye exhibits no discharge. Left eye exhibits no discharge. No scleral icterus.  Neck: Normal range of motion. Neck supple. No thyromegaly present.  Cardiovascular: Normal rate, regular rhythm, normal heart sounds and intact distal pulses.  Exam reveals no gallop and no friction rub.   No murmur heard. Pulmonary/Chest: Effort normal and breath sounds normal. No stridor. No respiratory distress. She has no wheezes. She has no rales.  Abdominal: Soft. Bowel sounds  are normal. She exhibits no distension. There is no tenderness. There is no rebound and no guarding.  Musculoskeletal: She exhibits no edema.       Back:  No bony tenderness to the L shoulder; no tenderness to palpation to left arm; paresthesia felt with light touch to left thumb and index finger, normal sensation throughout arm and other  fingers; full range of motion of shoulder with some pain; 5/5 strength in upper extremities; equal bilateral grip strength  Lymphadenopathy:    She has no cervical adenopathy.  Neurological: She is alert. Coordination normal.  Skin: Skin is warm and dry. No rash noted. She is not diaphoretic. No pallor.  Psychiatric: She has a normal mood and affect.  Nursing note and vitals reviewed.   ED Course  Procedures (including critical care time) Labs Review Labs Reviewed - No data to display  Imaging Review No results found. I have personally reviewed and evaluated these images and lab results as part of my medical decision-making.   EKG Interpretation None      MDM   Pt presents to the ER with shoulder and neck pain that is radiating down arm. Patient is a Leisure centre manager and left-handed and uses her left hand with repetitive motions at work a lot. There has been no recent trauma and imaging is not indicated at this time. No evidence of cervical nerve root compression on physical exam. DC w conservative home therapies (ie heat), as well as Naprosyn, and Norflex. Advised follow up and establish care with PCP for further evaluation and treatment. Patient understands and agrees with plan. Strict return precautions given. I discussed patient with Dr. Criss Alvine who is in agreement with plan. Patient vitals stable throughout ED course and discharged in satisfactory condition.   Final diagnoses:  Left shoulder pain  Paresthesia and pain of left extremity       Emi Holes, PA-C 01/07/16 0211  Pricilla Loveless, MD 01/11/16 0021

## 2016-01-06 NOTE — ED Notes (Signed)
Shoulder and arm pain and numbness toleft hand  X 1 week

## 2016-01-07 ENCOUNTER — Encounter (HOSPITAL_BASED_OUTPATIENT_CLINIC_OR_DEPARTMENT_OTHER): Payer: Self-pay | Admitting: Emergency Medicine

## 2016-02-10 ENCOUNTER — Emergency Department (HOSPITAL_COMMUNITY)
Admission: EM | Admit: 2016-02-10 | Discharge: 2016-02-10 | Disposition: A | Discharge status: Home / Self Care | Payer: Medicaid Other | Attending: Emergency Medicine | Admitting: Emergency Medicine

## 2016-02-10 ENCOUNTER — Encounter (HOSPITAL_COMMUNITY): Payer: Self-pay | Admitting: Emergency Medicine

## 2016-02-10 ENCOUNTER — Emergency Department (HOSPITAL_COMMUNITY): Payer: Medicaid Other

## 2016-02-10 DIAGNOSIS — N202 Calculus of kidney with calculus of ureter: Secondary | ICD-10-CM | POA: Insufficient documentation

## 2016-02-10 DIAGNOSIS — R109 Unspecified abdominal pain: Secondary | ICD-10-CM

## 2016-02-10 DIAGNOSIS — N201 Calculus of ureter: Secondary | ICD-10-CM

## 2016-02-10 DIAGNOSIS — N200 Calculus of kidney: Secondary | ICD-10-CM

## 2016-02-10 LAB — CBC WITH DIFFERENTIAL/PLATELET
BASOS ABS: 0 10*3/uL (ref 0.0–0.1)
BASOS PCT: 0 %
EOS PCT: 2 %
Eosinophils Absolute: 0.2 10*3/uL (ref 0.0–0.7)
HCT: 38.8 % (ref 36.0–46.0)
Hemoglobin: 12.9 g/dL (ref 12.0–15.0)
Lymphocytes Relative: 20 %
Lymphs Abs: 1.5 10*3/uL (ref 0.7–4.0)
MCH: 29.6 pg (ref 26.0–34.0)
MCHC: 33.2 g/dL (ref 30.0–36.0)
MCV: 89 fL (ref 78.0–100.0)
MONO ABS: 0.6 10*3/uL (ref 0.1–1.0)
Monocytes Relative: 8 %
NEUTROS ABS: 5.2 10*3/uL (ref 1.7–7.7)
Neutrophils Relative %: 70 %
PLATELETS: 326 10*3/uL (ref 150–400)
RBC: 4.36 MIL/uL (ref 3.87–5.11)
RDW: 12.8 % (ref 11.5–15.5)
WBC: 7.5 10*3/uL (ref 4.0–10.5)

## 2016-02-10 LAB — COMPREHENSIVE METABOLIC PANEL
ALK PHOS: 61 U/L (ref 38–126)
ALT: 34 U/L (ref 14–54)
ANION GAP: 4 — AB (ref 5–15)
AST: 26 U/L (ref 15–41)
Albumin: 3.9 g/dL (ref 3.5–5.0)
BILIRUBIN TOTAL: 0.5 mg/dL (ref 0.3–1.2)
BUN: 10 mg/dL (ref 6–20)
CALCIUM: 8.9 mg/dL (ref 8.9–10.3)
CO2: 27 mmol/L (ref 22–32)
Chloride: 104 mmol/L (ref 101–111)
Creatinine, Ser: 0.81 mg/dL (ref 0.44–1.00)
GFR calc Af Amer: >60 mL/min (ref >60)
GFR calc non Af Amer: >60 mL/min (ref >60)
Glucose, Bld: 102 mg/dL — ABNORMAL HIGH (ref 65–99)
POTASSIUM: 4.2 mmol/L (ref 3.5–5.1)
Sodium: 135 mmol/L (ref 135–145)
TOTAL PROTEIN: 6.9 g/dL (ref 6.5–8.1)

## 2016-02-10 LAB — URINALYSIS, ROUTINE W REFLEX MICROSCOPIC
BILIRUBIN URINE: NEGATIVE
Glucose, UA: NEGATIVE mg/dL
KETONES UR: NEGATIVE mg/dL
Nitrite: NEGATIVE
PROTEIN: 30 mg/dL — AB
Specific Gravity, Urine: 1.021 (ref 1.005–1.030)
pH: 6 (ref 5.0–8.0)

## 2016-02-10 LAB — URINE MICROSCOPIC-ADD ON

## 2016-02-10 LAB — LIPASE, BLOOD: LIPASE: 16 U/L (ref 11–51)

## 2016-02-10 MED ORDER — IBUPROFEN 600 MG PO TABS: 600.0000 mg | ORAL_TABLET | Freq: Four times a day (QID) | ORAL | Status: AC | PRN

## 2016-02-10 MED ORDER — ONDANSETRON HCL 4 MG/2ML IJ SOLN
4.0000 mg | Freq: Once | INTRAMUSCULAR | Status: AC
  Administered 2016-02-10: 4 mg via INTRAVENOUS
  Filled 2016-02-10: qty 2

## 2016-02-10 MED ORDER — SODIUM CHLORIDE 0.9 % IV BOLUS (SEPSIS)
500.0000 mL | Freq: Once | INTRAVENOUS | Status: AC
  Administered 2016-02-10: 500 mL via INTRAVENOUS

## 2016-02-10 MED ORDER — ONDANSETRON HCL 4 MG PO TABS: 4.0000 mg | ORAL_TABLET | Freq: Four times a day (QID) | ORAL | Status: AC

## 2016-02-10 MED ORDER — HYDROCODONE-ACETAMINOPHEN 5-325 MG PO TABS: 1.0000 | ORAL_TABLET | ORAL | Status: DC | PRN

## 2016-02-10 MED ORDER — KETOROLAC TROMETHAMINE 30 MG/ML IJ SOLN
30.0000 mg | Freq: Once | INTRAMUSCULAR | Status: AC
  Administered 2016-02-10: 30 mg via INTRAVENOUS
  Filled 2016-02-10: qty 1

## 2016-02-10 MED ORDER — TAMSULOSIN HCL 0.4 MG PO CAPS: 0.4000 mg | ORAL_CAPSULE | Freq: Every day | ORAL | Status: DC

## 2016-02-10 NOTE — ED Provider Notes (Signed)
CSN: [id number]     Arrival date & time 02/10/16  1053 History   First MD Initiated Contact with Patient 02/10/16 1106     Chief Complaint  Patient presents with  . Flank Pain     (Consider location/radiation/quality/duration/timing/severity/associated sxs/prior Treatment) HPI Dawn Morton is a 35 y.o. female reported history of Hysterectomy, kidney stones here for evaluation of left-sided flank pain. Patient reports this morning she had sudden onset left-sided flank pain that is typical of her kidney stone pain. She reports associated nausea and vomiting, nonbloody and nonbilious. She denies any urinary symptoms, diarrhea. She reports constipation is baseline for her. She reports she does not have any menses, had a hysterectomy. Has not tried anything to improve her symptoms. Nothing makes the problem better or worse. No fevers, chills or other medical complaints. No other modifying factors.  Past Medical History  Diagnosis Date  . Anemia     in early 90's  . Kidney stones    Past Surgical History  Procedure Laterality Date  . Orif fibula fracture Right 03/12/2015    Procedure: OPEN REDUCTION INTERNAL FIXATION (ORIF) RIGHT FIBULA FRACTURE;  Surgeon: Beverely Low, MD;  Location: [facility] OR;  Service: Orthopedics;  Laterality: Right;  . Abdominal hysterectomy     Family History  Problem Relation Age of Onset  . Hypertension Mother   . Heart attack Father   . Hypertension Father    Social History  Substance Use Topics  . Smoking status: Never Smoker   . Smokeless tobacco: None  . Alcohol Use: No     Comment: occasional   OB History    Gravida Para Term Preterm AB TAB SAB Ectopic Multiple Living   0 0 0 0 0 0 0 0       Review of Systems A 10 point review of systems was completed and was negative except for pertinent positives and negatives as mentioned in the history of present illness     Allergies  Review of patient's allergies indicates no known allergies.  Home  Medications   Prior to Admission medications   Medication Sig Start Date End Date Taking? Authorizing Provider  HYDROcodone-acetaminophen (NORCO/VICODIN) 5-325 MG tablet Take 1-2 tablets by mouth every 4 (four) hours as needed. 02/10/16   Joycie Peek, PA-C  ibuprofen (ADVIL,MOTRIN) 600 MG tablet Take 1 tablet (600 mg total) by mouth every 6 (six) hours as needed. 02/10/16   Joycie Peek, PA-C  ondansetron (ZOFRAN) 4 MG tablet Take 1 tablet (4 mg total) by mouth every 6 (six) hours. 02/10/16   Joycie Peek, PA-C  tamsulosin (FLOMAX) 0.4 MG CAPS capsule Take 1 capsule (0.4 mg total) by mouth daily. 02/10/16   Joycie Peek, PA-C   BP 107/67 mmHg  Pulse 98  Temp(Src) 98.2 F (36.8 C) (Oral)  Resp 20  SpO2 99% Physical Exam  Constitutional: She is oriented to person, place, and time. She appears well-developed and well-nourished. No distress.  HENT:  Head: Normocephalic and atraumatic.  Mouth/Throat: Oropharynx is clear and moist.  Eyes: Conjunctivae are normal. Pupils are equal, round, and reactive to light. Right eye exhibits no discharge. Left eye exhibits no discharge. No scleral icterus.  Neck: Neck supple.  Cardiovascular: Normal rate, regular rhythm and normal heart sounds.   Pulmonary/Chest: Effort normal and breath sounds normal. No respiratory distress. She has no wheezes. She has no rales.  Abdominal: Soft. There is no tenderness.  Mild left flank/CVA tenderness. Abdomen is otherwise soft, nondistended without rebound or guarding.  Musculoskeletal: She exhibits no tenderness.  Neurological: She is alert and oriented to person, place, and time.  Cranial Nerves II-XII grossly intact  Skin: Skin is warm and dry. No rash noted. She is not diaphoretic.  Psychiatric: She has a normal mood and affect.  Nursing note and vitals reviewed.   ED Course  Procedures (including critical care time) Labs Review Labs Reviewed  COMPREHENSIVE METABOLIC PANEL - Abnormal; Notable  for the following:    Glucose, Bld 102 (*)    Anion gap 4 (*)    All other components within normal limits  URINALYSIS, ROUTINE W REFLEX MICROSCOPIC (NOT AT [facility]) - Abnormal; Notable for the following:    APPearance CLOUDY (*)    Hgb urine dipstick LARGE (*)    Protein, ur 30 (*)    Leukocytes, UA SMALL (*)    All other components within normal limits  URINE MICROSCOPIC-ADD ON - Abnormal; Notable for the following:    Squamous Epithelial / LPF 6-30 (*)    Bacteria, UA MANY (*)    All other components within normal limits  URINE CULTURE  CBC WITH DIFFERENTIAL/PLATELET  LIPASE, BLOOD    Imaging Review Ct Renal Stone Study  02/10/2016  CLINICAL DATA:  Back pain, right side pain, history of stones EXAM: CT ABDOMEN AND PELVIS WITHOUT CONTRAST TECHNIQUE: Multidetector CT imaging of the abdomen and pelvis was performed following the standard protocol without IV contrast. COMPARISON:  01/21/2015 FINDINGS: Lower chest:   lung bases are unremarkable Hepatobiliary: Unenhanced liver shows no biliary ductal dilatation. Ill defined low-density lesion in left hepatic lobe again noted measures 3.5 by 2.2 cm stable from prior exam. Pancreas: Unenhanced pancreas is unremarkable. Spleen: Unenhanced spleen is unremarkable. Adrenals/Urinary Tract: No adrenal gland mass. There is mild left hydronephrosis and proximal left hydroureter. There is a punctate nonobstructive calculus in upper pole of the right kidney measures 2 mm. Nonobstructive calculus in midpole of the left kidney measures 2.7 mm. Nonobstructive calculus in lower pole of the right kidney measures 3 mm. Axial image 38 there is 5 mm calcified obstructive calculus in mid left ureter at the level of L4 vertebral body. No calcified calculi are noted within distal ureter or within urinary bladder. Stomach/Bowel: No small bowel obstruction. No thickened or dilated small bowel loops. Terminal ileum is unremarkable. No pericecal inflammation. Normal appendix is  noted in axial image 34. Vascular/Lymphatic: No retroperitoneal or mesenteric adenopathy. No aortic aneurysm. Reproductive: The uterus is surgically absent. Small amount of free fluid noted within posterior pelvis. The ovaries are unremarkable. Other: No free abdominal air. Musculoskeletal: No destructive bony lesions are noted. Sagittal images of the spine shows minimal anterior spurring upper endplate of L4 vertebral body. IMPRESSION: 1. Bilateral nonobstructive nephrolithiasis. 2. There is mild left hydronephrosis and proximal left hydroureter. There is 5 mm calcified obstructive calculus in mid left ureter at the level of L4 vertebral body. 3. Normal appendix.  No pericecal inflammation. 4. Surgical absent uterus. 5. Small amount of pelvic free fluid is noted within posterior pelvis. No evidence of free abdominal air. 6. No calcified calculi are noted within urinary bladder. These results were called by telephone at the time of interpretation on 02/10/2016 at 2:02 pm to Dr. Joycie PeekBENJAMIN Batsheva Stevick , who verbally acknowledged these results. Electronically Signed   By: Natasha MeadLiviu  Pop M.D.   On: 02/10/2016 14:02   I have personally reviewed and evaluated these images and lab results as part of my medical decision-making.   EKG Interpretation None  Filed Vitals:   02/10/16 1230 02/10/16 1300 02/10/16 1330 02/10/16 1400  BP: 113/67 119/60 115/53 107/67  Pulse: 81 84 100 98  Temp:      TempSrc:      Resp:      SpO2: 99% 97% 98% 99%    MDM  Patient with reported history of kidney stones, here for pain consistent with previous kidney stones. Physical exam is reassuring, benign abdominal exam. She is hemodynamically stable, afebrile. Pending screening labs. We will obtain CT renal stone as patient has not been evaluated for this in the past. CT shows 5 mm stone in left mid ureter with bilateral renal stones, mild left hydronephrosis. Creatinine 0.8 . Urine with large hemoglobin, small leukocytes and many  bacteria, however there were many epithelial cells, suspect dirty catch. Will obtain urine culture. Patient pain treated in emergency department. Tolerating oral fluids. We'll discharge with symptomatic support, given referral to urology for follow-up and definitive care. Return precautions discussed. She overall appears well, nontoxic, hemodynamically stable and appropriate for discharge. Final diagnoses:  Nephrolithiasis  Ureterolithiasis  Left flank pain        Joycie Peek, PA-C 02/10/16 1436   Loren Racer, MD 02/26/16 [phone]

## 2016-02-10 NOTE — ED Notes (Signed)
Patient transported to CT 

## 2016-02-10 NOTE — ED Notes (Signed)
Pt. Given urine specimen cup at this time and instructed to give sample.

## 2016-02-10 NOTE — Discharge Instructions (Signed)
You were evaluated in the ED today for your left flank pain. You were found to have active kidney stone. Please take your medications as prescribed to help with your discomfort. It is important feet follow-up with urology, Dr. Sherryl BartersBudzyn, for reevaluation and definitive care. Return to ED for new or worsening symptoms as we discussed.

## 2016-02-10 NOTE — ED Notes (Signed)
Pt sts left sided abd pain and flank pain; pt sts hx of kidney stone and feels same with some N/V

## 2016-02-10 NOTE — ED Notes (Signed)
EDP at bedside  

## 2016-02-11 LAB — URINE CULTURE

## 2016-08-21 IMAGING — CT CT RENAL STONE PROTOCOL
2 of 4 series · 12 of 46 positions shown, 14 images · non-contrast
Comparison: 01/21/2015

CLINICAL DATA: Back pain, right side pain, history of stones

EXAM:
CT ABDOMEN AND PELVIS WITHOUT CONTRAST
TECHNIQUE: Multidetector CT imaging of the abdomen and pelvis was performed
following the standard protocol without IV contrast.

[Series 201: stone study, idose (2) · axial · 0.68mm/px · z∈[-587,-242]mm · 9 of 82 slices shown, 11 images]
[im 7/82  soft-tissue]
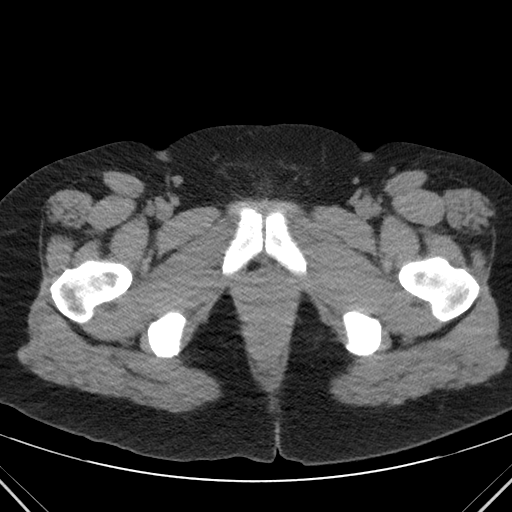
[im 7/82  bone]
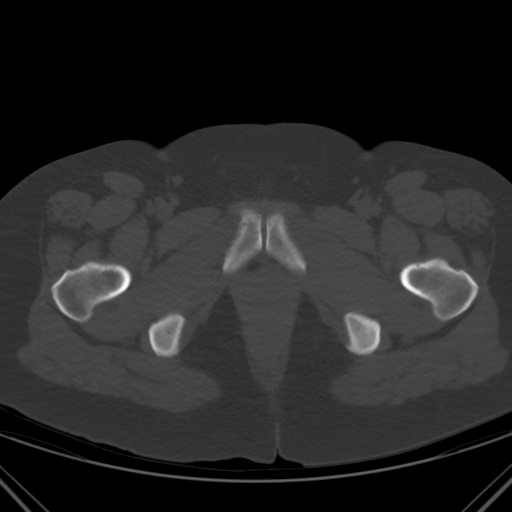
[im 16/82  soft-tissue]
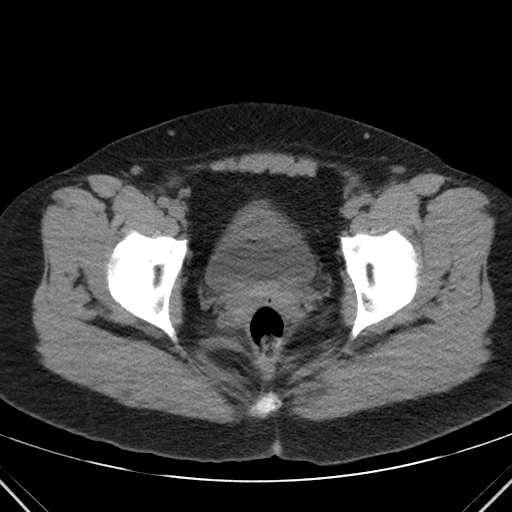
[im 25/82  soft-tissue]
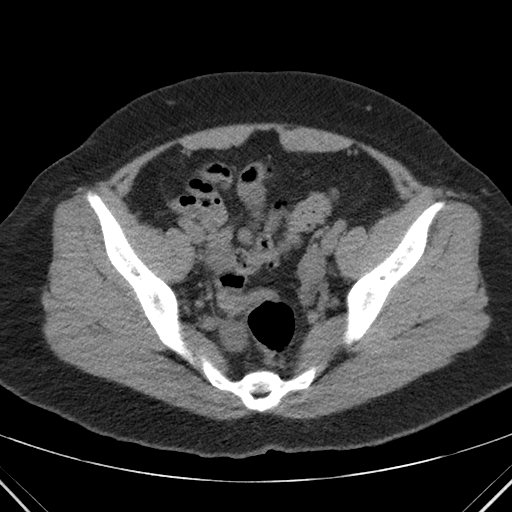
[im 34/82  soft-tissue]
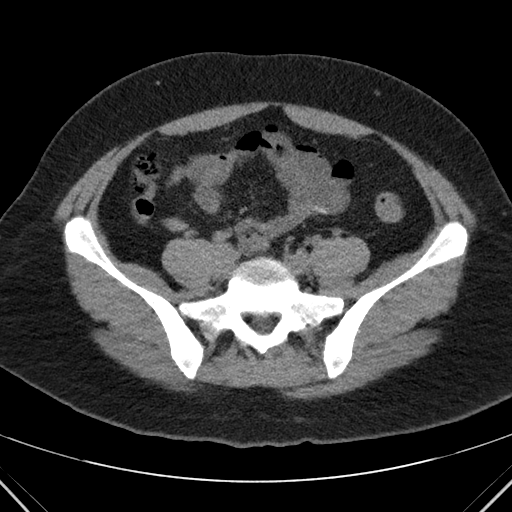
[im 43/82  soft-tissue]
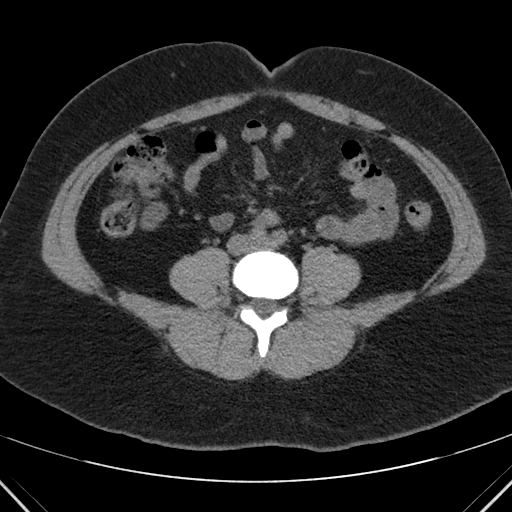
[im 49/82  soft-tissue]
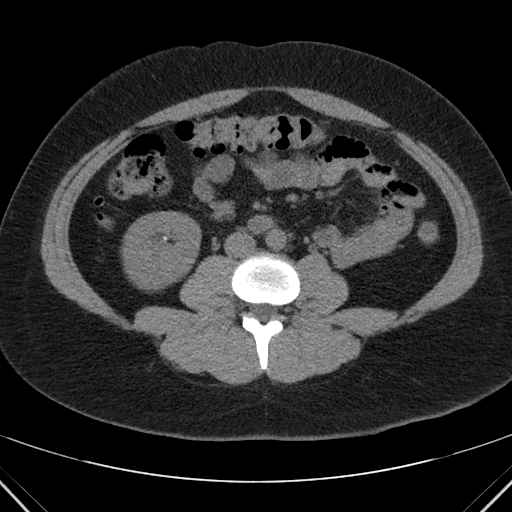
[im 58/82  soft-tissue]
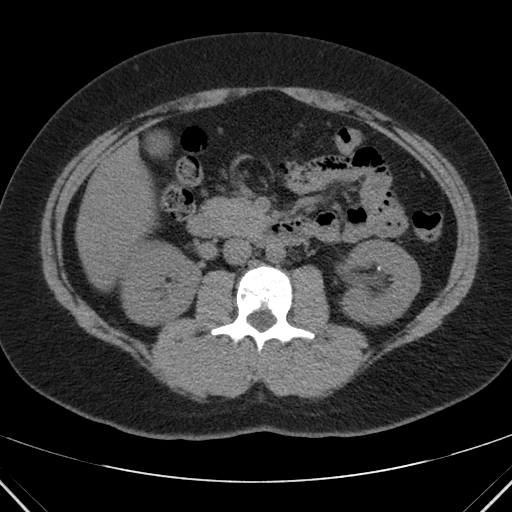
[im 67/82  soft-tissue]
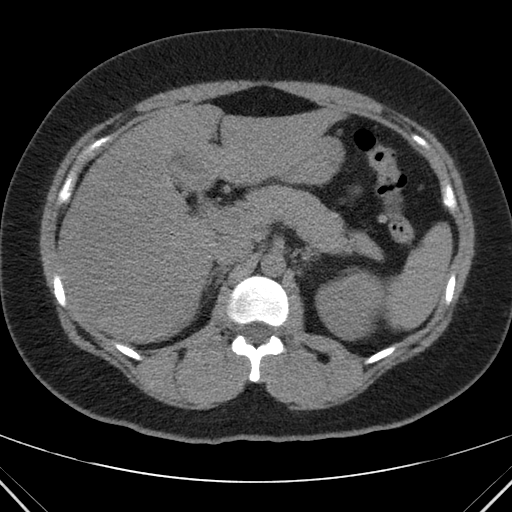
[im 76/82  soft-tissue]
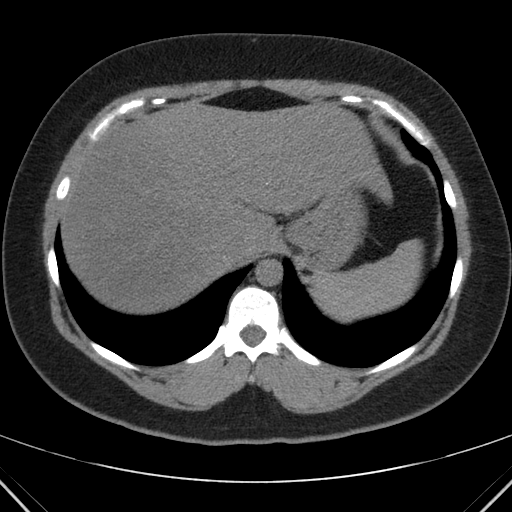
[im 76/82  bone]
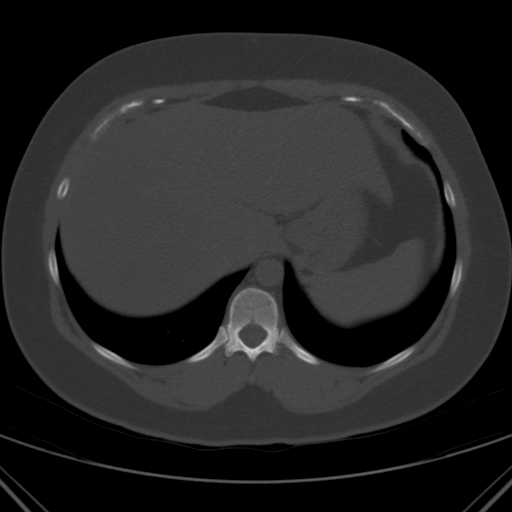

[Series 203: coronals, idose (2) · coronal · 0.45mm/px · 3 of 106 slices shown]
[im 36/106  soft-tissue]
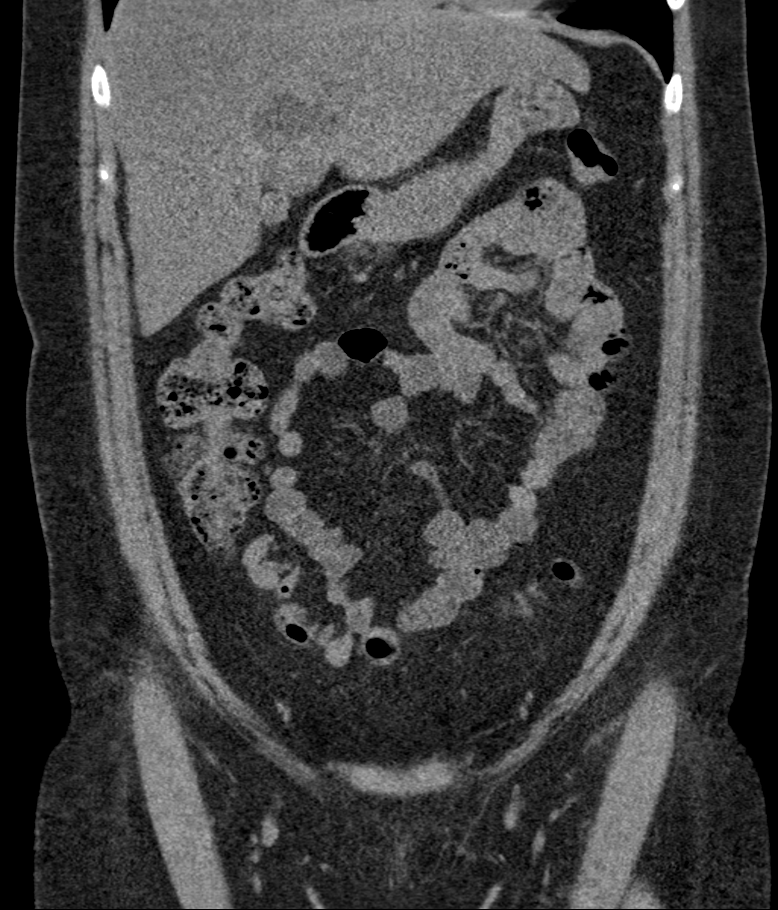
[im 47/106  soft-tissue]
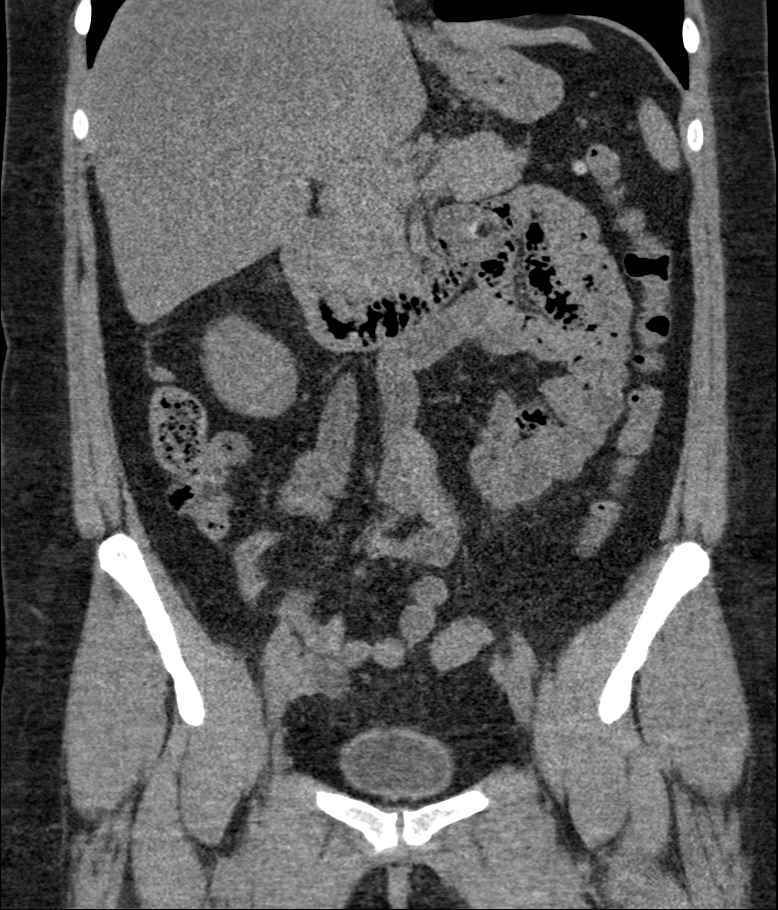
[im 59/106  soft-tissue]
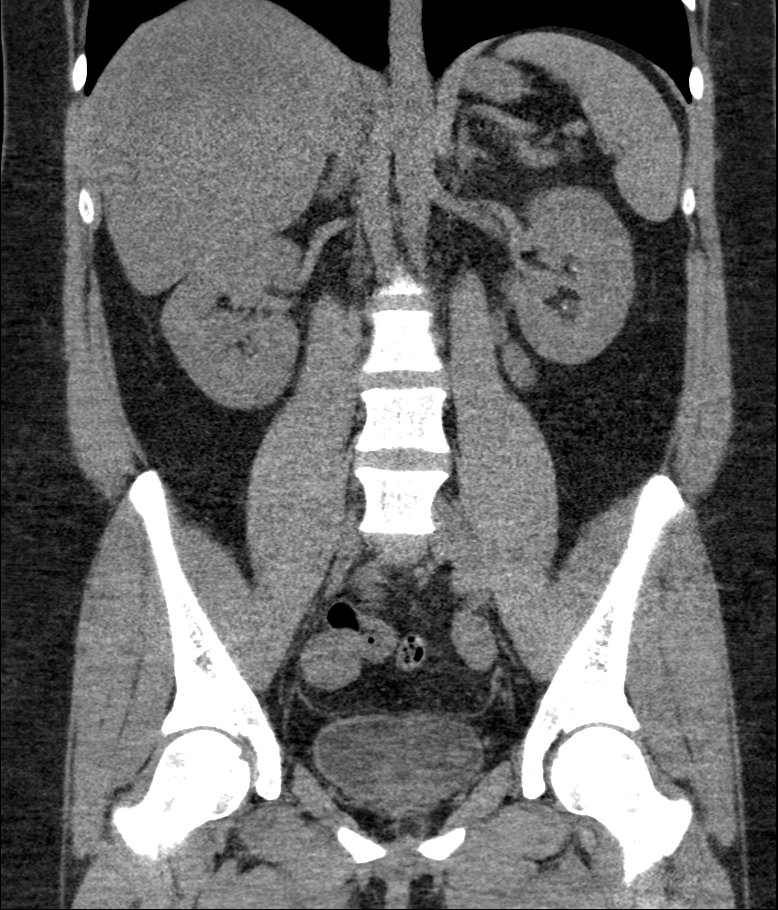

[12 of 46 positions shown; findings below may reference images not displayed]

FINDINGS: Lower chest:   lung bases are unremarkable

Hepatobiliary: Unenhanced liver shows no biliary ductal dilatation.
Ill defined low-density lesion in left hepatic lobe again noted
measures 3.5 by 2.2 cm stable from prior exam.

Pancreas: Unenhanced pancreas is unremarkable.

Spleen: Unenhanced spleen is unremarkable.

Adrenals/Urinary Tract: No adrenal gland mass. There is mild left
hydronephrosis and proximal left hydroureter. There is a punctate
nonobstructive calculus in upper pole of the right kidney measures 2
mm. Nonobstructive calculus in midpole of the left kidney measures
2.7 mm. Nonobstructive calculus in lower pole of the right kidney
measures 3 mm. Axial image 38 there is 5 mm calcified obstructive
calculus in mid left ureter at the level of L4 vertebral body. No
calcified calculi are noted within distal ureter or within urinary
bladder.

Stomach/Bowel: No small bowel obstruction. No thickened or dilated
small bowel loops. Terminal ileum is unremarkable. No pericecal
inflammation. Normal appendix is noted in axial image 34.

Vascular/Lymphatic: No retroperitoneal or mesenteric adenopathy. No
aortic aneurysm.

Reproductive: The uterus is surgically absent. Small amount of free
fluid noted within posterior pelvis. The ovaries are unremarkable.

Other: No free abdominal air.

Musculoskeletal: No destructive bony lesions are noted. Sagittal
images of the spine shows minimal anterior spurring upper endplate
of L4 vertebral body.
IMPRESSION: 1. Bilateral nonobstructive nephrolithiasis.
2. There is mild left hydronephrosis and proximal left hydroureter.
There is 5 mm calcified obstructive calculus in mid left ureter at
the level of L4 vertebral body.
3. Normal appendix.  No pericecal inflammation.
4. Surgical absent uterus.
5. Small amount of pelvic free fluid is noted within posterior
pelvis. No evidence of free abdominal air.
6. No calcified calculi are noted within urinary bladder.
These results were called by telephone at the time of interpretation
on 02/10/2016 at [DATE] to Dr. ANMOL HEISER , who verbally
acknowledged these results.

## 2016-11-07 ENCOUNTER — Emergency Department (HOSPITAL_COMMUNITY): Payer: Medicaid Other

## 2016-11-07 ENCOUNTER — Encounter (HOSPITAL_COMMUNITY): Payer: Self-pay | Admitting: Emergency Medicine

## 2016-11-07 ENCOUNTER — Emergency Department (HOSPITAL_COMMUNITY)
Admission: EM | Admit: 2016-11-07 | Discharge: 2016-11-07 | Disposition: A | Discharge status: Home / Self Care | Payer: Medicaid Other | Attending: Emergency Medicine | Admitting: Emergency Medicine

## 2016-11-07 DIAGNOSIS — N3001 Acute cystitis with hematuria: Secondary | ICD-10-CM

## 2016-11-07 LAB — CBC WITH DIFFERENTIAL/PLATELET
BASOS ABS: 0 10*3/uL (ref 0.0–0.1)
Basophils Relative: 0 %
Eosinophils Absolute: 0.3 10*3/uL (ref 0.0–0.7)
Eosinophils Relative: 3 %
HCT: 38.6 % (ref 36.0–46.0)
HEMOGLOBIN: 13.1 g/dL (ref 12.0–15.0)
LYMPHS ABS: 1.7 10*3/uL (ref 0.7–4.0)
LYMPHS PCT: 19 %
MCH: 30.1 pg (ref 26.0–34.0)
MCHC: 33.9 g/dL (ref 30.0–36.0)
MCV: 88.7 fL (ref 78.0–100.0)
Monocytes Absolute: 0.4 10*3/uL (ref 0.1–1.0)
Monocytes Relative: 5 %
Neutro Abs: 6.4 10*3/uL (ref 1.7–7.7)
Neutrophils Relative %: 73 %
PLATELETS: 313 10*3/uL (ref 150–400)
RBC: 4.35 MIL/uL (ref 3.87–5.11)
RDW: 12.9 % (ref 11.5–15.5)
WBC: 8.8 10*3/uL (ref 4.0–10.5)

## 2016-11-07 LAB — BASIC METABOLIC PANEL
Anion gap: 8 (ref 5–15)
BUN: 8 mg/dL (ref 6–20)
CALCIUM: 8.4 mg/dL — AB (ref 8.9–10.3)
CO2: 24 mmol/L (ref 22–32)
CREATININE: 0.73 mg/dL (ref 0.44–1.00)
Chloride: 104 mmol/L (ref 101–111)
GFR calc Af Amer: >60 mL/min (ref >60)
GFR calc non Af Amer: >60 mL/min (ref >60)
Glucose, Bld: 101 mg/dL — ABNORMAL HIGH (ref 65–99)
Potassium: 3.8 mmol/L (ref 3.5–5.1)
Sodium: 136 mmol/L (ref 135–145)

## 2016-11-07 LAB — URINALYSIS, ROUTINE W REFLEX MICROSCOPIC
Bilirubin Urine: NEGATIVE
Glucose, UA: NEGATIVE mg/dL
Ketones, ur: NEGATIVE mg/dL
Nitrite: POSITIVE — AB
Protein, ur: 100 mg/dL — AB
Specific Gravity, Urine: 1.016 (ref 1.005–1.030)
pH: 6 (ref 5.0–8.0)

## 2016-11-07 MED ORDER — PHENAZOPYRIDINE HCL 200 MG PO TABS: 200.0000 mg | ORAL_TABLET | Freq: Three times a day (TID) | ORAL | 0 refills | Status: AC | PRN

## 2016-11-07 MED ORDER — CEPHALEXIN 250 MG PO CAPS
500.0000 mg | ORAL_CAPSULE | Freq: Once | ORAL | Status: AC
  Administered 2016-11-07: 500 mg via ORAL
  Filled 2016-11-07: qty 2

## 2016-11-07 MED ORDER — PHENAZOPYRIDINE HCL 100 MG PO TABS
95.0000 mg | ORAL_TABLET | Freq: Once | ORAL | Status: AC
  Administered 2016-11-07: 100 mg via ORAL
  Filled 2016-11-07: qty 1

## 2016-11-07 MED ORDER — ONDANSETRON 4 MG PO TBDP
4.0000 mg | ORAL_TABLET | Freq: Once | ORAL | Status: AC | PRN
  Administered 2016-11-07: 4 mg via ORAL

## 2016-11-07 MED ORDER — MORPHINE SULFATE (PF) 4 MG/ML IV SOLN
4.0000 mg | Freq: Once | INTRAVENOUS | Status: AC
  Administered 2016-11-07: 4 mg via INTRAVENOUS
  Filled 2016-11-07: qty 1

## 2016-11-07 MED ORDER — ONDANSETRON HCL 4 MG/2ML IJ SOLN
4.0000 mg | Freq: Once | INTRAMUSCULAR | Status: AC
  Administered 2016-11-07: 4 mg via INTRAVENOUS
  Filled 2016-11-07: qty 2

## 2016-11-07 MED ORDER — ONDANSETRON 4 MG PO TBDP
ORAL_TABLET | ORAL | Status: AC
  Filled 2016-11-07: qty 1

## 2016-11-07 MED ORDER — CEPHALEXIN 500 MG PO CAPS: 500.0000 mg | ORAL_CAPSULE | Freq: Four times a day (QID) | ORAL | 0 refills | Status: DC

## 2016-11-07 NOTE — Discharge Instructions (Signed)
Take the prescribed medication as directed. Follow-up with urology if you continue having any ongoing issues with flank pain/urinary symptoms. Return to the ED for new or worsening symptoms.

## 2016-11-07 NOTE — ED Triage Notes (Signed)
Pt c/o lower abdominal pain that radiates to R flank and back side with N/V, urinary frequency and urgency since last night. Pt reports hx of kidney stones and infection. She denies fevers this time. Pt took AZO, cranberry pills, and Flomax to help but has had no relief.

## 2016-11-07 NOTE — ED Provider Notes (Signed)
[facility]-EMERGENCY DEPT Provider Note   CSN: [id number] Arrival date & time: 11/07/16  0148     History   Chief Complaint Chief Complaint  Patient presents with  . Flank Pain    HPI Dawn Morton is a 36 y.o. female.  The history is provided by the patient and medical records.  Flank Pain     36 y.o. F with hx of anemia and kidney stones, presenting to the ED for abdominal pain. Patient states this began about a week ago.  States Initially it was lower abdominal pain started radiating into the right flank. She reports some nausea and vomiting as well as urinary frequency and dysuria. She has noticed some small hematuria as well. She denies any fevers or chills. She denies any pelvic pain or vaginal discharge. She is status post hysterectomy. Patient has tried taking AZO, cranberry pills, and Flomax without relief. She is not currently followed by urology but did recently get insurance again so is hoping to follow-up soon.   Past Medical History:  Diagnosis Date  . Anemia    in early 90's  . Kidney stones     There are no active problems to display for this patient.   Past Surgical History:  Procedure Laterality Date  . ABDOMINAL HYSTERECTOMY    . ORIF FIBULA FRACTURE Right 03/12/2015   Procedure: OPEN REDUCTION INTERNAL FIXATION (ORIF) RIGHT FIBULA FRACTURE;  Surgeon: Beverely Low, MD;  Location: [facility] OR;  Service: Orthopedics;  Laterality: Right;    OB History    Gravida Para Term Preterm AB Living   0 0 0 0 0     SAB TAB Ectopic Multiple Live Births   0 0 0           Home Medications    Prior to Admission medications   Medication Sig Start Date End Date Taking? Authorizing Provider  HYDROcodone-acetaminophen (NORCO/VICODIN) 5-325 MG tablet Take 1-2 tablets by mouth every 4 (four) hours as needed. 02/10/16   Joycie Peek, PA-C  ibuprofen (ADVIL,MOTRIN) 600 MG tablet Take 1 tablet (600 mg total) by mouth every 6 (six) hours as needed. 02/10/16   Joycie Peek, PA-C  ondansetron (ZOFRAN) 4 MG tablet Take 1 tablet (4 mg total) by mouth every 6 (six) hours. 02/10/16   Joycie Peek, PA-C  tamsulosin (FLOMAX) 0.4 MG CAPS capsule Take 1 capsule (0.4 mg total) by mouth daily. 02/10/16   Joycie Peek, PA-C    Family History Family History  Problem Relation Age of Onset  . Hypertension Mother   . Heart attack Father   . Hypertension Father     Social History Social History  Substance Use Topics  . Smoking status: Never Smoker  . Smokeless tobacco: Never Used  . Alcohol use Yes     Comment: occasional     Allergies   Patient has no known allergies.   Review of Systems Review of Systems  Genitourinary: Positive for dysuria, flank pain and frequency.  All other systems reviewed and are negative.    Physical Exam Updated Vital Signs BP (!) 146/74 (BP Location: Right Arm)   Pulse (!) 106   Temp 97.7 F (36.5 C) (Oral)   Resp 20   Ht  (1.549 m)   Wt 81.6 kg   SpO2 99%   BMI 34.01 kg/m   Physical Exam  Constitutional: She is oriented to person, place, and time. She appears well-developed and well-nourished.  Appears uncomfortable  HENT:  Head: Normocephalic and atraumatic.  Mouth/Throat: Oropharynx is clear and moist.  Eyes: Conjunctivae and EOM are normal. Pupils are equal, round, and reactive to light.  Neck: Normal range of motion.  Cardiovascular: Normal rate, regular rhythm and normal heart sounds.   Pulmonary/Chest: Effort normal and breath sounds normal.  Abdominal: Soft. Bowel sounds are normal. There is tenderness in the suprapubic area. There is CVA tenderness (right).  Musculoskeletal: Normal range of motion.  Neurological: She is alert and oriented to person, place, and time.  Skin: Skin is warm and dry.  Psychiatric: She has a normal mood and affect.  Nursing note and vitals reviewed.    ED Treatments / Results  Labs (all labs ordered are listed, but only abnormal results are  displayed) Labs Reviewed  URINALYSIS, ROUTINE W REFLEX MICROSCOPIC - Abnormal; Notable for the following:       Result Value   Color, Urine AMBER (*)    APPearance CLOUDY (*)    Hgb urine dipstick MODERATE (*)    Protein, ur 100 (*)    Nitrite POSITIVE (*)    Leukocytes, UA LARGE (*)    Bacteria, UA MANY (*)    Squamous Epithelial / LPF 0-5 (*)    Non Squamous Epithelial 0-5 (*)    All other components within normal limits  BASIC METABOLIC PANEL - Abnormal; Notable for the following:    Glucose, Bld 101 (*)    Calcium 8.4 (*)    All other components within normal limits  CBC WITH DIFFERENTIAL/PLATELET    EKG  EKG Interpretation None       Radiology Ct Renal Stone Study  Result Date: 11/07/2016 CLINICAL DATA:  Right flank pain for 2 days. Now with low abdominal pain. EXAM: CT ABDOMEN AND PELVIS WITHOUT CONTRAST TECHNIQUE: Multidetector CT imaging of the abdomen and pelvis was performed following the standard protocol without IV contrast. COMPARISON:  02/10/2016 FINDINGS: Lower chest: The lung bases are clear. Hepatobiliary: Mild diffuse fatty infiltration of the liver. Central hypodense lesion in the medial segment of the left lobe of liver adjacent to the porta hepatis measuring 2.7 cm 2.7 x 2.9 cm in diameter. No change since prior study. Gallbladder and bile ducts are unremarkable. Pancreas: Unremarkable. No pancreatic ductal dilatation or surrounding inflammatory changes. Spleen: Normal in size without focal abnormality. Adrenals/Urinary Tract: No adrenal gland nodules. Bilateral intrarenal stones, measuring up to about 3 mm diameter. No hydronephrosis or hydroureter. No ureteral stones are demonstrated. Bladder wall is mildly thickened with infiltration in the surrounding fat. This may indicate cystitis. The bladder is mostly decompressed, limiting evaluation. No filling defects identified. Stomach/Bowel: Stomach and small bowel are decompressed. Diffusely stool-filled colon  without abnormal distention or wall thickening. Appendix is normal. Vascular/Lymphatic: No significant vascular findings are present. No enlarged abdominal or pelvic lymph nodes. Reproductive: Status post hysterectomy. No adnexal masses. Other: No abdominal wall hernia or abnormality. No abdominopelvic ascites. Musculoskeletal: No destructive bone lesions. IMPRESSION: Bilateral nonobstructing intrarenal stones. No ureteral stone or obstruction demonstrated. Bladder is decompressed but there appears to be bladder wall thickening and infiltration which may indicate cystitis. Mild diffuse fatty infiltration of the liver. Low-attenuation lesion in the medial left lobe of the liver is indeterminate but unchanged since prior studies. Electronically Signed   By: Burman Nieves M.D.   On: 11/07/2016 03:40    Procedures Procedures (including critical care time)  Medications Ordered in ED Medications  ondansetron (ZOFRAN-ODT) disintegrating tablet 4 mg (4 mg Oral Given 11/07/16 0202)  morphine 4 MG/ML injection 4  mg (4 mg Intravenous Given 11/07/16 0344)  ondansetron (ZOFRAN) injection 4 mg (4 mg Intravenous Given 11/07/16 0344)  cephALEXin (KEFLEX) capsule 500 mg (500 mg Oral Given 11/07/16 0435)  phenazopyridine (PYRIDIUM) tablet 100 mg (100 mg Oral Given 11/07/16 0435)     Initial Impression / Assessment and Plan / ED Course  I have reviewed the triage vital signs and the nursing notes.  Pertinent labs & imaging results that were available during my care of the patient were reviewed by me and considered in my medical decision making (see chart for details).  36 year old female here with right flank pain. Reports his been ongoing for a few days now. She has a history of UTIs as well as kidney stones. Here she is afebrile and nontoxic but she does appear uncomfortable. She has right CVA tenderness as well as suprapubic tenderness. She denies any pelvic complaints. She is status post hysterectomy. UA nitrite  positive. A sig lab work is reassuring, normal white count. Given her history of kidney stones, CT renal study was obtained which is negative for acute ureteral stone, does have bilateral nonobstructing renal stones as well as some inflammatory findings about the bladder which is likely secondary to cystitis given her UA results. After medications here patient appears much more comfortable. She is tolerating oral fluids well. Will start on Keflex and Pyridium, first dose given here. Will plan to discharge home with same. Encouraged close PCP follow-up. She was also given urology follow-up.  Discussed plan with patient, she acknowledged understanding and agreed with plan of care.  Return precautions given for new or worsening symptoms.  Final Clinical Impressions(s) / ED Diagnoses   Final diagnoses:  Acute cystitis with hematuria    New Prescriptions Discharge Medication List as of 11/07/2016  4:22 AM    START taking these medications   Details  cephALEXin (KEFLEX) 500 MG capsule Take 1 capsule (500 mg total) by mouth 4 (four) times daily., Starting Tue 11/07/2016, Print    phenazopyridine (PYRIDIUM) 200 MG tablet Take 1 tablet (200 mg total) by mouth 3 (three) times daily as needed for pain., Starting Tue 11/07/2016, Print         Garlon Hatchet, PA-C 11/07/16 0531    Layla Maw Ward, DO 11/07/16 [phone]

## 2017-06-29 ENCOUNTER — Other Ambulatory Visit: Payer: Self-pay

## 2017-06-29 ENCOUNTER — Emergency Department (HOSPITAL_COMMUNITY)
Admission: EM | Admit: 2017-06-29 | Discharge: 2017-06-30 | Disposition: A | Discharge status: Home / Self Care | Payer: Managed Care, Other (non HMO) | Attending: Emergency Medicine | Admitting: Emergency Medicine

## 2017-06-29 ENCOUNTER — Encounter (HOSPITAL_COMMUNITY): Payer: Self-pay

## 2017-06-29 DIAGNOSIS — R519 Headache, unspecified: Secondary | ICD-10-CM

## 2017-06-29 DIAGNOSIS — R51 Headache: Secondary | ICD-10-CM

## 2017-06-29 DIAGNOSIS — R110 Nausea: Secondary | ICD-10-CM | POA: Diagnosis not present

## 2017-06-29 DIAGNOSIS — J018 Other acute sinusitis: Secondary | ICD-10-CM

## 2017-06-29 DIAGNOSIS — J019 Acute sinusitis, unspecified: Secondary | ICD-10-CM | POA: Diagnosis not present

## 2017-06-29 MED ORDER — DIPHENHYDRAMINE HCL 50 MG/ML IJ SOLN
25.0000 mg | Freq: Once | INTRAMUSCULAR | Status: AC
  Administered 2017-06-29: 25 mg via INTRAVENOUS
  Filled 2017-06-29: qty 1

## 2017-06-29 MED ORDER — KETOROLAC TROMETHAMINE 30 MG/ML IJ SOLN
30.0000 mg | Freq: Once | INTRAMUSCULAR | Status: AC
  Administered 2017-06-29: 30 mg via INTRAVENOUS
  Filled 2017-06-29: qty 1

## 2017-06-29 MED ORDER — METOCLOPRAMIDE HCL 5 MG/ML IJ SOLN
10.0000 mg | Freq: Once | INTRAMUSCULAR | Status: AC
  Administered 2017-06-29: 10 mg via INTRAVENOUS
  Filled 2017-06-29: qty 2

## 2017-06-29 MED ORDER — DEXAMETHASONE SODIUM PHOSPHATE 10 MG/ML IJ SOLN
10.0000 mg | Freq: Once | INTRAMUSCULAR | Status: AC
  Administered 2017-06-29: 10 mg via INTRAVENOUS
  Filled 2017-06-29: qty 1

## 2017-06-29 MED ORDER — SODIUM CHLORIDE 0.9 % IV BOLUS (SEPSIS)
1000.0000 mL | Freq: Once | INTRAVENOUS | Status: AC
  Administered 2017-06-29: 1000 mL via INTRAVENOUS

## 2017-06-29 NOTE — ED Provider Notes (Signed)
[facility] EMERGENCY DEPARTMENT Provider Note   CSN: [id number] Arrival date & time: 06/29/17  1448     History   Chief Complaint Chief Complaint  Patient presents with  . Headache    HPI Dawn Morton is a 36 y.o. female with a PMHx of migraines, anemia, and kidney stones, and PSHx of abd hysterectomy, who presents to the ED with complaints of persistent sinus congestion/rhinorrhea and headaches which have been ongoing for about 2 weeks.  Patient states that 2 weeks ago she began having URI symptoms including body aches, chills, dry cough, congestion, greenish rhinorrhea.  After few days she developed headaches.  Over the course of the last 2 weeks, the body aches, chills, and cough have improved, however her congestion, rhinorrhea, and headaches have continued to persist.  She describes the headaches as 10/10 constant pressure-like pain in the right occiput into the temple, worse with coughing and bright lights, and minimally transiently improved with Mucinex, Aleve, NyQuil, Excedrin, Tylenol, ibuprofen, Claritin, and aspirin.  She reports associated lightheadedness and nausea along with the headaches.  She states this feels slightly different than her migraines and that it is in a different area, however otherwise feels somewhat similar to her migraines.  She denies any known sick contacts, head injury, or recent tick bites.  She denies any fevers, ongoing chills, ongoing cough, ear pain or drainage, sore throat, neck stiffness, vision changes, CP, SOB, abdominal pain, vomiting, diarrhea, constipation, dysuria, hematuria, ongoing body aches, numbness, tingling, focal weakness, or any other complaints at this time.   The history is provided by the patient and medical records. No language interpreter was used.  Headache   This is a new problem. The current episode started more than 1 week ago. The problem occurs constantly. The problem has not changed since  onset.Associated with: URI. The pain is located in the right unilateral region. Quality: pressure. The pain is at a severity of 10/10. The pain is moderate. The pain does not radiate. Associated symptoms include nausea. Pertinent negatives include no fever, no shortness of breath and no vomiting. She has tried NSAIDs and acetaminophen (mucinex, aleve, nyquil, excedrin, tylenol, ibuprofen, claritin, and ASA) for the symptoms. The treatment provided mild relief.    Past Medical History:  Diagnosis Date  . Anemia    in early 90's  . Kidney stones     There are no active problems to display for this patient.   Past Surgical History:  Procedure Laterality Date  . ABDOMINAL HYSTERECTOMY    . ORIF FIBULA FRACTURE Right 03/12/2015   Procedure: OPEN REDUCTION INTERNAL FIXATION (ORIF) RIGHT FIBULA FRACTURE;  Surgeon: Beverely Low, MD;  Location: [facility] OR;  Service: Orthopedics;  Laterality: Right;    OB History    Gravida Para Term Preterm AB Living   0 0 0 0 0     SAB TAB Ectopic Multiple Live Births   0 0 0           Home Medications    Prior to Admission medications   Medication Sig Start Date End Date Taking? Authorizing Provider  Aspirin-Acetaminophen-Caffeine (EXCEDRIN PO) Take 2 tablets by mouth every 6 (six) hours as needed (pain).   Yes [provider]  ibuprofen (ADVIL,MOTRIN) 200 MG tablet Take 800 mg by mouth every 6 (six) hours as needed for headache or mild pain.   Yes [provider]  HYDROcodone-acetaminophen (NORCO/VICODIN) 5-325 MG tablet Take 1-2 tablets by mouth every 4 (four) hours as needed.  Patient not taking: Reported on 06/29/2017 02/10/16   Joycie Peekartner, Benjamin, PA-C  ibuprofen (ADVIL,MOTRIN) 600 MG tablet Take 1 tablet (600 mg total) by mouth every 6 (six) hours as needed. Patient not taking: Reported on 06/29/2017 02/10/16   Joycie Peekartner, Benjamin, PA-C  ondansetron (ZOFRAN) 4 MG tablet Take 1 tablet (4 mg total) by mouth every 6 (six) hours. Patient not  taking: Reported on 06/29/2017 02/10/16   Joycie Peekartner, Benjamin, PA-C  phenazopyridine (PYRIDIUM) 200 MG tablet Take 1 tablet (200 mg total) by mouth 3 (three) times daily as needed for pain. Patient not taking: Reported on 06/29/2017 11/07/16   Garlon HatchetSanders, Lisa M, PA-C  tamsulosin (FLOMAX) 0.4 MG CAPS capsule Take 1 capsule (0.4 mg total) by mouth daily. Patient not taking: Reported on 06/29/2017 02/10/16   Joycie Peekartner, Benjamin, PA-C    Family History Family History  Problem Relation Age of Onset  . Hypertension Mother   . Heart attack Father   . Hypertension Father     Social History Social History   Tobacco Use  . Smoking status: Never Smoker  . Smokeless tobacco: Never Used  Substance Use Topics  . Alcohol use: Yes    Comment: occasional  . Drug use: No     Allergies   Patient has no known allergies.   Review of Systems Review of Systems  Constitutional: Positive for chills (improved). Negative for fever.  HENT: Positive for congestion and rhinorrhea. Negative for ear discharge, ear pain and sore throat.   Eyes: Negative for visual disturbance.  Respiratory: Positive for cough (improved). Negative for shortness of breath.   Cardiovascular: Negative for chest pain.  Gastrointestinal: Positive for nausea. Negative for abdominal pain, constipation, diarrhea and vomiting.  Genitourinary: Negative for dysuria and hematuria.  Musculoskeletal: Positive for myalgias (improved). Negative for arthralgias and neck stiffness.  Skin: Negative for color change and rash.  Allergic/Immunologic: Negative for immunocompromised state.  Neurological: Positive for light-headedness and headaches. Negative for weakness and numbness.  Psychiatric/Behavioral: Negative for confusion.   All other systems reviewed and are negative for acute change except as noted in the HPI.    Physical Exam Updated Vital Signs BP (!) 148/79 (BP Location: Right Arm)   Pulse 79   Temp 98.3 F (36.8 C) (Oral)   Resp 14    SpO2 100%   Physical Exam  Constitutional: She is oriented to person, place, and time. Vital signs are normal. She appears well-developed and well-nourished.  Non-toxic appearance. No distress.  Afebrile, nontoxic, NAD  HENT:  Head: Normocephalic and atraumatic.  Right Ear: Hearing, tympanic membrane, external ear and ear canal normal.  Left Ear: Hearing, tympanic membrane, external ear and ear canal normal.  Nose: Mucosal edema and rhinorrhea present. Right sinus exhibits frontal sinus tenderness. Right sinus exhibits no maxillary sinus tenderness. Left sinus exhibits no maxillary sinus tenderness and no frontal sinus tenderness.  Mouth/Throat: Uvula is midline, oropharynx is clear and moist and mucous membranes are normal. No trismus in the jaw. No uvula swelling.  Ears are clear bilaterally. Nose with moderate mucosal edema bilaterally R>L with mild greenish rhinorrhea noted in nasal passages; mild R frontal sinus TTP. Oropharynx clear and moist, without uvular swelling or deviation, no trismus or drooling, no exudates  Eyes: Conjunctivae and EOM are normal. Pupils are equal, round, and reactive to light. Right eye exhibits no discharge. Left eye exhibits no discharge.  PERRL, EOMI, no nystagmus, no visual field deficits   Neck: Normal range of motion. Neck supple. Muscular tenderness present.  No spinous process tenderness present. No neck rigidity. Normal range of motion present.  FROM intact without spinous process TTP, no bony stepoffs or deformities, with mild R sided paraspinous muscle TTP and slight muscle spasms. No rigidity or meningeal signs. No bruising or swelling.   Cardiovascular: Normal rate, regular rhythm, normal heart sounds and intact distal pulses. Exam reveals no gallop and no friction rub.  No murmur heard. Pulmonary/Chest: Effort normal and breath sounds normal. No respiratory distress. She has no decreased breath sounds. She has no wheezes. She has no rhonchi. She has no  rales.  Abdominal: Soft. Normal appearance and bowel sounds are normal. She exhibits no distension. There is no tenderness. There is no rigidity, no rebound, no guarding, no CVA tenderness, no tenderness at McBurney's point and negative Murphy's sign.  Musculoskeletal: Normal range of motion.  MAE x4 Strength and sensation grossly intact in all extremities Distal pulses intact Gait steady  Lymphadenopathy:    She has cervical adenopathy.  Scattered cervical LAD  Neurological: She is alert and oriented to person, place, and time. She has normal strength. No cranial nerve deficit or sensory deficit. Coordination and gait normal. GCS eye subscore is 4. GCS verbal subscore is 5. GCS motor subscore is 6.  CN 2-12 grossly intact A&O x4 GCS 15 Sensation and strength intact Gait nonataxic including with tandem walking Coordination with finger-to-nose WNL Neg pronator drift   Skin: Skin is warm, dry and intact. No rash noted.  Psychiatric: She has a normal mood and affect.  Nursing note and vitals reviewed.    ED Treatments / Results  Labs (all labs ordered are listed, but only abnormal results are displayed) Labs Reviewed - No data to display  EKG  EKG Interpretation None       Radiology No results found.  Procedures Procedures (including critical care time)  Medications Ordered in ED Medications  metoCLOPramide (REGLAN) injection 10 mg (10 mg Intravenous Given 06/29/17 2323)    And  diphenhydrAMINE (BENADRYL) injection 25 mg (25 mg Intravenous Given 06/29/17 2323)    And  sodium chloride 0.9 % bolus 1,000 mL (0 mLs Intravenous Stopped 06/30/17 0105)    And  ketorolac (TORADOL) 30 MG/ML injection 30 mg (30 mg Intravenous Given 06/29/17 2323)  dexamethasone (DECADRON) injection 10 mg (10 mg Intravenous Given 06/29/17 2323)     Initial Impression / Assessment and Plan / ED Course  I have reviewed the triage vital signs and the nursing notes.  Pertinent labs & imaging  results that were available during my care of the patient were reviewed by me and considered in my medical decision making (see chart for details).     36 y.o. female here with sinus congestion/URI symptoms x2wks which mostly improved but she continued to have sinus congestion and headaches, feeling lightheaded and nauseated from the headaches. OTC meds help transiently and minimally. On exam, no focal neuro deficits, mild tenderness to R frontal sinus, moderate nasal congestion and greenish rhinorrhea. Clear lung exam. Ears clear. Mild right paracervical muscle tenderness and spasm, scattered cervical LAD. Likely sinusitis, at this time given duration and persisting symptoms, will consider treating with empiric abx. Doubt need for imaging or further emergent work up. Will give migraine cocktail and decadron to help with headache today and prevent rebound. Will reassess after that.  1:37 AM Pt feeling much better, symptoms resolved entirely. Will d/c home with empiric abx for sinusitis, and flonase, advised OTC remedies for symptomatic relief, and will rx reglan  for nausea/headaches. Discussed sinus rinses. F/up with [facility] in 1wk for recheck of symptoms and to establish medical care. I explained the diagnosis and have given explicit precautions to return to the ER including for any other new or worsening symptoms. The patient understands and accepts the medical plan as it's been dictated and I have answered their questions. Discharge instructions concerning home care and prescriptions have been given. The patient is STABLE and is discharged to home in good condition.    Final Clinical Impressions(s) / ED Diagnoses   Final diagnoses:  Other acute sinusitis, recurrence not specified  Sinus headache  Nausea    ED Discharge Orders        Ordered    amoxicillin-clavulanate (AUGMENTIN) 875-125 MG tablet  2 times daily     06/30/17 0136    fluticasone (FLONASE) 50 MCG/ACT nasal spray  Daily      06/30/17 0136    metoCLOPramide (REGLAN) 10 MG tablet  Every 6 hours PRN     06/30/17 [address], [place], [place] 06/30/17 4098    Vanetta Mulders, MD 06/30/17 1526

## 2017-06-29 NOTE — ED Triage Notes (Addendum)
Pt states she has had URI symptoms for about a week. She reports she has had a headache for several days. She reports he has pain in the back of her neck into her head. Pt alert and oriented X4. Pt states headache is worse with loud noises. She has used aspirin without relief.

## 2017-06-30 MED ORDER — FLUTICASONE PROPIONATE 50 MCG/ACT NA SUSP: 2.0000 | Freq: Every day | NASAL | 0 refills | Status: AC

## 2017-06-30 MED ORDER — AMOXICILLIN-POT CLAVULANATE 875-125 MG PO TABS: 1.0000 | ORAL_TABLET | Freq: Two times a day (BID) | ORAL | 0 refills | Status: AC

## 2017-06-30 MED ORDER — METOCLOPRAMIDE HCL 10 MG PO TABS: 10.0000 mg | ORAL_TABLET | Freq: Four times a day (QID) | ORAL | 0 refills | Status: AC | PRN

## 2017-06-30 NOTE — Discharge Instructions (Addendum)
Alternate between tylenol and motrin as needed for pain. Use reglan as needed for headaches or nausea. Stay well hydrated. Get plenty of rest. Use Mucinex for cough suppression/expectoration of mucus. Use netipot and flonase to help with nasal congestion. May consider over-the-counter Benadryl or other antihistamine to decrease secretions and for help with your symptoms. Take antibiotic as directed until completed, to treat your suspected sinus infection. Follow up with the [facility] in 1 week for recheck of symptoms and to establish medical care. Return to emergency department for emergent changing or worsening of symptoms.

## 2022-12-24 ENCOUNTER — Emergency Department (HOSPITAL_BASED_OUTPATIENT_CLINIC_OR_DEPARTMENT_OTHER)
Admission: EM | Admit: 2022-12-24 | Discharge: 2022-12-24 | Disposition: A | Discharge status: Home / Self Care | Payer: Managed Care, Other (non HMO) | Attending: Emergency Medicine | Admitting: Emergency Medicine

## 2022-12-24 ENCOUNTER — Encounter (HOSPITAL_BASED_OUTPATIENT_CLINIC_OR_DEPARTMENT_OTHER): Payer: Self-pay

## 2022-12-24 ENCOUNTER — Emergency Department (HOSPITAL_BASED_OUTPATIENT_CLINIC_OR_DEPARTMENT_OTHER): Payer: Managed Care, Other (non HMO)

## 2022-12-24 ENCOUNTER — Other Ambulatory Visit: Payer: Self-pay

## 2022-12-24 DIAGNOSIS — E876 Hypokalemia: Secondary | ICD-10-CM | POA: Diagnosis not present

## 2022-12-24 DIAGNOSIS — N202 Calculus of kidney with calculus of ureter: Secondary | ICD-10-CM | POA: Insufficient documentation

## 2022-12-24 DIAGNOSIS — N200 Calculus of kidney: Secondary | ICD-10-CM

## 2022-12-24 DIAGNOSIS — R109 Unspecified abdominal pain: Secondary | ICD-10-CM | POA: Diagnosis present

## 2022-12-24 LAB — URINALYSIS, ROUTINE W REFLEX MICROSCOPIC
Bilirubin Urine: NEGATIVE
Glucose, UA: NEGATIVE mg/dL
Hgb urine dipstick: NEGATIVE
Ketones, ur: NEGATIVE mg/dL
Leukocytes,Ua: NEGATIVE
Nitrite: NEGATIVE
Protein, ur: NEGATIVE mg/dL
Specific Gravity, Urine: 1.015 (ref 1.005–1.030)
pH: 7 (ref 5.0–8.0)

## 2022-12-24 LAB — CBC WITH DIFFERENTIAL/PLATELET
Abs Immature Granulocytes: 0.03 10*3/uL (ref 0.00–0.07)
Basophils Absolute: 0.1 10*3/uL (ref 0.0–0.1)
Basophils Relative: 1 %
Eosinophils Absolute: 0.2 10*3/uL (ref 0.0–0.5)
Eosinophils Relative: 3 %
HCT: 36.3 % (ref 36.0–46.0)
Hemoglobin: 12.4 g/dL (ref 12.0–15.0)
Immature Granulocytes: 0 %
Lymphocytes Relative: 26 %
Lymphs Abs: 1.8 10*3/uL (ref 0.7–4.0)
MCH: 29.8 pg (ref 26.0–34.0)
MCHC: 34.2 g/dL (ref 30.0–36.0)
MCV: 87.3 fL (ref 80.0–100.0)
Monocytes Absolute: 0.5 10*3/uL (ref 0.1–1.0)
Monocytes Relative: 7 %
Neutro Abs: 4.5 10*3/uL (ref 1.7–7.7)
Neutrophils Relative %: 63 %
Platelets: 425 10*3/uL — ABNORMAL HIGH (ref 150–400)
RBC: 4.16 MIL/uL (ref 3.87–5.11)
RDW: 12.4 % (ref 11.5–15.5)
WBC: 7.1 10*3/uL (ref 4.0–10.5)
nRBC: 0 % (ref 0.0–0.2)

## 2022-12-24 LAB — COMPREHENSIVE METABOLIC PANEL
ALT: 23 U/L (ref 0–44)
AST: 25 U/L (ref 15–41)
Albumin: 3.2 g/dL — ABNORMAL LOW (ref 3.5–5.0)
Alkaline Phosphatase: 66 U/L (ref 38–126)
Anion gap: 9 (ref 5–15)
BUN: 18 mg/dL (ref 6–20)
CO2: 29 mmol/L (ref 22–32)
Calcium: 8.1 mg/dL — ABNORMAL LOW (ref 8.9–10.3)
Chloride: 99 mmol/L (ref 98–111)
Creatinine, Ser: 0.8 mg/dL (ref 0.44–1.00)
GFR, Estimated: >60 mL/min (ref >60)
Glucose, Bld: 113 mg/dL — ABNORMAL HIGH (ref 70–99)
Potassium: 3.1 mmol/L — ABNORMAL LOW (ref 3.5–5.1)
Sodium: 137 mmol/L (ref 135–145)
Total Bilirubin: 0.3 mg/dL (ref 0.3–1.2)
Total Protein: 7.2 g/dL (ref 6.5–8.1)

## 2022-12-24 LAB — HCG, SERUM, QUALITATIVE: Preg, Serum: NEGATIVE

## 2022-12-24 LAB — LIPASE, BLOOD: Lipase: 32 U/L (ref 11–51)

## 2022-12-24 MED ORDER — MORPHINE SULFATE (PF) 2 MG/ML IV SOLN
2.0000 mg | Freq: Once | INTRAVENOUS | Status: AC
  Administered 2022-12-24: 2 mg via INTRAVENOUS
  Filled 2022-12-24: qty 1

## 2022-12-24 MED ORDER — KETOROLAC TROMETHAMINE 15 MG/ML IJ SOLN
15.0000 mg | Freq: Once | INTRAMUSCULAR | Status: AC
  Administered 2022-12-24: 15 mg via INTRAVENOUS
  Filled 2022-12-24: qty 1

## 2022-12-24 MED ORDER — ONDANSETRON 4 MG PO TBDP: 4.0000 mg | ORAL_TABLET | Freq: Three times a day (TID) | ORAL | 0 refills | Status: DC | PRN

## 2022-12-24 MED ORDER — POTASSIUM CHLORIDE CRYS ER 20 MEQ PO TBCR
40.0000 meq | EXTENDED_RELEASE_TABLET | Freq: Once | ORAL | Status: AC
  Administered 2022-12-24: 40 meq via ORAL
  Filled 2022-12-24: qty 2

## 2022-12-24 MED ORDER — ONDANSETRON HCL 4 MG/2ML IJ SOLN
4.0000 mg | Freq: Once | INTRAMUSCULAR | Status: AC
  Administered 2022-12-24: 4 mg via INTRAVENOUS
  Filled 2022-12-24: qty 2

## 2022-12-24 MED ORDER — HYDROCODONE-ACETAMINOPHEN 5-325 MG PO TABS: 2.0000 | ORAL_TABLET | ORAL | 0 refills | Status: AC | PRN

## 2022-12-24 MED ORDER — LACTATED RINGERS IV BOLUS
1000.0000 mL | Freq: Once | INTRAVENOUS | Status: AC
Start: 2022-12-24 — End: 2022-12-24
  Administered 2022-12-24: 1000 mL via INTRAVENOUS

## 2022-12-24 MED ORDER — MORPHINE SULFATE (PF) 4 MG/ML IV SOLN
4.0000 mg | Freq: Once | INTRAVENOUS | Status: AC
  Administered 2022-12-24: 4 mg via INTRAVENOUS
  Filled 2022-12-24: qty 1

## 2022-12-24 MED ORDER — TAMSULOSIN HCL 0.4 MG PO CAPS: 0.4000 mg | ORAL_CAPSULE | Freq: Every day | ORAL | 0 refills | Status: AC

## 2022-12-24 NOTE — ED Provider Notes (Signed)
[place] [facility] Provider Note   CSN: [id number] Arrival date & time: 12/24/22  1839     History  Chief Complaint  Patient presents with   Flank Pain    Dawn Morton is a 42 y.o. female.  42 year old female with past medical history significant for kidney stones presents today for flank pain as well as abdominal pain associated with nausea but no vomiting.  Endorses dysuria but denies any hematuria.  Symptoms started about 2 days ago.   The history is provided by the patient. No language interpreter was used.       Home Medications Prior to Admission medications   Medication Sig Start Date End Date Taking? Authorizing Provider  amoxicillin-clavulanate (AUGMENTIN) 875-125 MG tablet Take 1 tablet by mouth 2 (two) times daily. One po bid x 7 days 06/30/17   Street, [place], [place]  Aspirin-Acetaminophen-Caffeine (EXCEDRIN PO) Take 2 tablets by mouth every 6 (six) hours as needed (pain).    [provider]  fluticasone (FLONASE) 50 MCG/ACT nasal spray Place 2 sprays into both nostrils daily. 06/30/17   Street, [place], PA-C  HYDROcodone-acetaminophen (NORCO/VICODIN) 5-325 MG tablet Take 1-2 tablets by mouth every 4 (four) hours as needed. Patient not taking: Reported on 06/29/2017 02/10/16   Joycie Peek, PA-C  ibuprofen (ADVIL,MOTRIN) 200 MG tablet Take 800 mg by mouth every 6 (six) hours as needed for headache or mild pain.    [provider]  ibuprofen (ADVIL,MOTRIN) 600 MG tablet Take 1 tablet (600 mg total) by mouth every 6 (six) hours as needed. Patient not taking: Reported on 06/29/2017 02/10/16   Joycie Peek, PA-C  metoCLOPramide (REGLAN) 10 MG tablet Take 1 tablet (10 mg total) by mouth every 6 (six) hours as needed for nausea (nausea/headache). 06/30/17   Street, [place], PA-C  ondansetron (ZOFRAN) 4 MG tablet Take 1 tablet (4 mg total) by mouth every 6 (six) hours. Patient not taking: Reported on  06/29/2017 02/10/16   Joycie Peek, PA-C  phenazopyridine (PYRIDIUM) 200 MG tablet Take 1 tablet (200 mg total) by mouth 3 (three) times daily as needed for pain. Patient not taking: Reported on 06/29/2017 11/07/16   Garlon Hatchet, PA-C  tamsulosin (FLOMAX) 0.4 MG CAPS capsule Take 1 capsule (0.4 mg total) by mouth daily. Patient not taking: Reported on 06/29/2017 02/10/16   Joycie Peek, PA-C      Allergies    Penicillins    Review of Systems   Review of Systems  Constitutional:  Negative for chills and fever.  Gastrointestinal:  Positive for abdominal pain and nausea. Negative for vomiting.  Genitourinary:  Positive for dysuria. Negative for difficulty urinating and hematuria.  All other systems reviewed and are negative.   Physical Exam Updated Vital Signs BP (!) 149/89   Pulse 91   Temp 98.7 F (37.1 C) (Oral)   Resp 18   Ht 5\' 1"  (1.549 m)   Wt 83.9 kg   SpO2 95%   BMI 34.96 kg/m  Physical Exam Vitals and nursing note reviewed.  Constitutional:      General: She is not in acute distress.    Appearance: Normal appearance. She is not ill-appearing.  HENT:     Head: Normocephalic and atraumatic.     Nose: Nose normal.  Eyes:     General: No scleral icterus.    Extraocular Movements: Extraocular movements intact.     Conjunctiva/sclera: Conjunctivae normal.  Cardiovascular:     Rate and Rhythm: Normal rate and  regular rhythm.     Heart sounds: Normal heart sounds.  Pulmonary:     Effort: Pulmonary effort is normal. No respiratory distress.     Breath sounds: Normal breath sounds. No wheezing or rales.  Abdominal:     General: There is no distension.     Palpations: Abdomen is soft.     Tenderness: There is no abdominal tenderness. There is no guarding.  Musculoskeletal:        General: Normal range of motion.     Cervical back: Normal range of motion.  Skin:    General: Skin is warm and dry.  Neurological:     General: No focal deficit present.      Mental Status: She is alert. Mental status is at baseline.     ED Results / Procedures / Treatments   Labs (all labs ordered are listed, but only abnormal results are displayed) Labs Reviewed  LIPASE, BLOOD  URINALYSIS, ROUTINE W REFLEX MICROSCOPIC  CBC WITH DIFFERENTIAL/PLATELET  COMPREHENSIVE METABOLIC PANEL  HCG, SERUM, QUALITATIVE    EKG None  Radiology CT Renal Stone Study  Result Date: 12/24/2022 CLINICAL DATA:  Abdominal/flank pain, stone suspected History of kidney stones. EXAM: CT ABDOMEN AND PELVIS WITHOUT CONTRAST TECHNIQUE: Multidetector CT imaging of the abdomen and pelvis was performed following the standard protocol without IV contrast. RADIATION DOSE REDUCTION: This exam was performed according to the departmental dose-optimization program which includes automated exposure control, adjustment of the mA and/or kV according to patient size and/or use of iterative reconstruction technique. COMPARISON:  Most recent CT 11/07/2016 FINDINGS: Lower chest: Clear lung bases. Hepatobiliary: Diffuse hepatic steatosis. Area of hypodensity in the central liver adjacent to the porta hepatis measures 2.9 x 2 cm, not significantly changed from prior exam. No new liver abnormality. Decompressed gallbladder. No calcified gallstone. No biliary dilatation. Pancreas: No ductal dilatation or inflammation. Spleen: Normal in size without focal abnormality. Adrenals/Urinary Tract: Normal adrenal glands. There is a 3 mm stone at the left ureterovesicular junction, but no dilatation of the left renal collecting system or ureter. There is mild prominence of the right renal pelvis, however no stone within the right renal collecting system or ureter. There are punctate bilateral intrarenal calculi, 4 stones in the right kidney, and 4 stones on the left. The urinary bladder is partially distended. Stomach/Bowel: Stomach is within normal limits. Appendix appears normal. No evidence of bowel wall thickening,  distention, or inflammatory changes. Vascular/Lymphatic: Normal caliber abdominal aorta. No enlarged lymph nodes in the abdomen or pelvis. Reproductive: Hysterectomy. Physiologic follicular cyst in the right ovary measuring 16 mm, needs no further imaging follow-up. Quiescent left ovary. Other: No free air, free fluid, or intra-abdominal fluid collection. Tiny fat containing umbilical hernia. Musculoskeletal: There are no acute or suspicious osseous abnormalities. IMPRESSION: 1. A 3 mm stone at the left ureterovesicular junction, but no proximal obstruction or hydronephrosis. 2. Mild prominence of the right renal pelvis without obstructing calculus, may indicate recently passed stone. 3. Additional nonobstructing stones in both kidneys. 4. Hepatic steatosis. Area of hypodensity in the central liver adjacent to the porta hepatis is unchanged from prior exams, nonspecific but possible focal fatty deposition. Stability is consistent with a benign process. Electronically Signed   By: Narda Rutherford M.D.   On: 12/24/2022 19:28    Procedures Procedures    Medications Ordered in ED Medications  lactated ringers bolus 1,000 mL (1,000 mLs Intravenous New Bag/Given 12/24/22 1926)  ondansetron (ZOFRAN) injection 4 mg (4 mg Intravenous Given  12/24/22 1926)  morphine (PF) 4 MG/ML injection 4 mg (4 mg Intravenous Given 12/24/22 1926)    ED Course/ Medical Decision Making/ A&P                             Medical Decision Making Amount and/or Complexity of Data Reviewed Labs: ordered. Radiology: ordered.  Risk Prescription drug management.   Medical Decision Making / ED Course   This patient presents to the ED for concern of flank pain, this involves an extensive number of treatment options, and is a complaint that carries with it a high risk of complications and morbidity.  The differential diagnosis includes nephrolithiasis, pyelonephritis, UTI, appendicitis, cholecystitis  MDM: 42 year old female  presents today for concern of flank pain.  She has history of kidney stones.  Denies hematuria but endorses dysuria and associated nausea.  Denies any vomiting.  She states this feels similar to her prior episodes of kidney stones.  CBC is unremarkable.  CMP shows potassium of 3.1 otherwise without acute concerns.  UA without evidence of UTI.  Lipase within normal limits.  Pregnancy test negative.  CT renal stone study shows right-sided 3 mm stone at the UV junction.  Without hydronephrosis or obstruction.  Preserved renal function.  On reevaluation pain is controlled with medications in the emergency department.  She is appropriate for discharge.  Will provide her with pain control, nausea medication, and Flomax.  Urology referral given.  Discussed adequate hydration.  P.o. repletion given for hypokalemia.  Discussed she will need to follow-up with PCP to have this repeated to ensure that is staying normal.  Patient requested addition to dose of pain medication prior to leaving. Lab Tests: -I ordered, reviewed, and interpreted labs.   The pertinent results include:   Labs Reviewed  CBC WITH DIFFERENTIAL/PLATELET - Abnormal; Notable for the following components:      Result Value   Platelets 425 (*)    All other components within normal limits  LIPASE, BLOOD  URINALYSIS, ROUTINE W REFLEX MICROSCOPIC  COMPREHENSIVE METABOLIC PANEL  HCG, SERUM, QUALITATIVE      EKG  EKG Interpretation  Date/Time:    Ventricular Rate:    PR Interval:    QRS Duration:   QT Interval:    QTC Calculation:   R Axis:     Text Interpretation:           Imaging Studies ordered: I ordered imaging studies including ct renal stone study I independently visualized and interpreted imaging. I agree with the radiologist interpretation   Medicines ordered and prescription drug management: Meds ordered this encounter  Medications   lactated ringers bolus 1,000 mL   ondansetron (ZOFRAN) injection 4 mg    morphine (PF) 4 MG/ML injection 4 mg    -I have reviewed the patients home medicines and have made adjustments as needed   Reevaluation: After the interventions noted above, I reevaluated the patient and found that they have :improved  Co morbidities that complicate the patient evaluation  Past Medical History:  Diagnosis Date   Anemia    in early 52's   Kidney stones       Dispostion: Patient is appropriate for discharge.  Discharged in stable condition.  Return precaution discussed.  Patient voices understanding and is in agreement with plan.  Final Clinical Impression(s) / ED Diagnoses Final diagnoses:  Nephrolithiasis    Rx / DC Orders ED Discharge Orders  Ordered    HYDROcodone-acetaminophen (NORCO/VICODIN) 5-325 MG tablet  Every 4 hours PRN        12/24/22 2017    ondansetron (ZOFRAN-ODT) 4 MG disintegrating tablet  Every 8 hours PRN        12/24/22 2017    tamsulosin (FLOMAX) 0.4 MG CAPS capsule  Daily        12/24/22 2017              Marita Kansas, PA-C 12/24/22 2018    Vanetta Mulders, MD 12/26/22 1630

## 2022-12-24 NOTE — Discharge Instructions (Signed)
Your workup today shows 3 mm kidney stone.  No obstruction associated with this.  You should be able to pass this.  Drink plenty of fluids.  Have sent pain medication, nausea medication, and Flomax sent to the pharmacy for you.  Have given you information to follow-up with urology.  If you have any concerning or worsening symptoms return to the emergency room.

## 2022-12-24 NOTE — ED Triage Notes (Signed)
Patient having flank and ABD Pain. She is having nausea. She has a hx of kidney stones and stated this feels the same. She denied fever.

## 2023-03-07 ENCOUNTER — Encounter (HOSPITAL_COMMUNITY): Payer: Self-pay

## 2023-03-07 ENCOUNTER — Emergency Department (HOSPITAL_COMMUNITY): Payer: Managed Care, Other (non HMO)

## 2023-03-07 ENCOUNTER — Other Ambulatory Visit: Payer: Self-pay

## 2023-03-07 ENCOUNTER — Emergency Department (HOSPITAL_COMMUNITY)
Admission: EM | Admit: 2023-03-07 | Discharge: 2023-03-07 | Disposition: A | Discharge status: Home / Self Care | Payer: Managed Care, Other (non HMO) | Source: Home / Self Care | Attending: Emergency Medicine | Admitting: Emergency Medicine

## 2023-03-07 ENCOUNTER — Emergency Department (HOSPITAL_COMMUNITY)
Admission: EM | Admit: 2023-03-07 | Discharge: 2023-03-07 | Disposition: A | Discharge status: Home / Self Care | Payer: Managed Care, Other (non HMO) | Attending: Emergency Medicine | Admitting: Emergency Medicine

## 2023-03-07 DIAGNOSIS — S0990XA Unspecified injury of head, initial encounter: Secondary | ICD-10-CM | POA: Insufficient documentation

## 2023-03-07 DIAGNOSIS — Z23 Encounter for immunization: Secondary | ICD-10-CM | POA: Insufficient documentation

## 2023-03-07 DIAGNOSIS — S61411A Laceration without foreign body of right hand, initial encounter: Secondary | ICD-10-CM | POA: Insufficient documentation

## 2023-03-07 DIAGNOSIS — S51812A Laceration without foreign body of left forearm, initial encounter: Secondary | ICD-10-CM | POA: Insufficient documentation

## 2023-03-07 DIAGNOSIS — Z5329 Procedure and treatment not carried out because of patient's decision for other reasons: Secondary | ICD-10-CM | POA: Insufficient documentation

## 2023-03-07 MED ORDER — LORAZEPAM 1 MG PO TABS
1.0000 mg | ORAL_TABLET | Freq: Once | ORAL | Status: AC
  Administered 2023-03-07: 1 mg via ORAL
  Filled 2023-03-07: qty 1

## 2023-03-07 MED ORDER — TETANUS-DIPHTH-ACELL PERTUSSIS 5-2.5-18.5 LF-MCG/0.5 IM SUSY: 0.5000 mL | PREFILLED_SYRINGE | Freq: Once | INTRAMUSCULAR | Status: DC

## 2023-03-07 MED ORDER — TETANUS-DIPHTH-ACELL PERTUSSIS 5-2.5-18.5 LF-MCG/0.5 IM SUSY
0.5000 mL | PREFILLED_SYRINGE | Freq: Once | INTRAMUSCULAR | Status: AC
  Administered 2023-03-07: 0.5 mL via INTRAMUSCULAR
  Filled 2023-03-07: qty 0.5

## 2023-03-07 MED ORDER — HYDROCODONE-ACETAMINOPHEN 5-325 MG PO TABS
1.0000 | ORAL_TABLET | Freq: Once | ORAL | Status: AC
  Administered 2023-03-07: 1 via ORAL
  Filled 2023-03-07: qty 1
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
## 2023-03-07 NOTE — ED Notes (Signed)
RN reached out to doctor for more pain meds
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
## 2023-03-07 NOTE — ED Notes (Signed)
RN wrapped wound- bulky dressing
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
## 2023-03-07 NOTE — ED Notes (Signed)
Pt refusing medical treatment. Pt advised on risks of leaving AMA. Pt verbalizes understanding.

## 2023-04-27 ENCOUNTER — Other Ambulatory Visit: Payer: Self-pay

## 2023-04-27 ENCOUNTER — Emergency Department (HOSPITAL_BASED_OUTPATIENT_CLINIC_OR_DEPARTMENT_OTHER): Payer: Managed Care, Other (non HMO)

## 2023-04-27 ENCOUNTER — Emergency Department (HOSPITAL_BASED_OUTPATIENT_CLINIC_OR_DEPARTMENT_OTHER)
Admission: EM | Admit: 2023-04-27 | Discharge: 2023-04-28 | Disposition: A | Discharge status: Home / Self Care | Payer: Managed Care, Other (non HMO) | Attending: Emergency Medicine | Admitting: Emergency Medicine

## 2023-04-27 ENCOUNTER — Encounter (HOSPITAL_BASED_OUTPATIENT_CLINIC_OR_DEPARTMENT_OTHER): Payer: Self-pay

## 2023-04-27 DIAGNOSIS — R079 Chest pain, unspecified: Secondary | ICD-10-CM

## 2023-04-27 DIAGNOSIS — R1012 Left upper quadrant pain: Secondary | ICD-10-CM | POA: Diagnosis not present

## 2023-04-27 DIAGNOSIS — Y92009 Unspecified place in unspecified non-institutional (private) residence as the place of occurrence of the external cause: Secondary | ICD-10-CM

## 2023-04-27 DIAGNOSIS — Z7982 Long term (current) use of aspirin: Secondary | ICD-10-CM | POA: Diagnosis not present

## 2023-04-27 DIAGNOSIS — M546 Pain in thoracic spine: Secondary | ICD-10-CM | POA: Insufficient documentation

## 2023-04-27 DIAGNOSIS — M545 Low back pain, unspecified: Secondary | ICD-10-CM | POA: Insufficient documentation

## 2023-04-27 DIAGNOSIS — R1033 Periumbilical pain: Secondary | ICD-10-CM | POA: Diagnosis not present

## 2023-04-27 DIAGNOSIS — R1013 Epigastric pain: Secondary | ICD-10-CM | POA: Insufficient documentation

## 2023-04-27 DIAGNOSIS — W19XXXA Unspecified fall, initial encounter: Secondary | ICD-10-CM | POA: Insufficient documentation

## 2023-04-27 DIAGNOSIS — R109 Unspecified abdominal pain: Secondary | ICD-10-CM

## 2023-04-27 DIAGNOSIS — R0789 Other chest pain: Secondary | ICD-10-CM | POA: Insufficient documentation

## 2023-04-27 DIAGNOSIS — M549 Dorsalgia, unspecified: Secondary | ICD-10-CM

## 2023-04-27 DIAGNOSIS — R1032 Left lower quadrant pain: Secondary | ICD-10-CM | POA: Insufficient documentation

## 2023-04-27 LAB — CBC WITH DIFFERENTIAL/PLATELET
Abs Immature Granulocytes: 0.03 10*3/uL (ref 0.00–0.07)
Basophils Absolute: 0.1 10*3/uL (ref 0.0–0.1)
Basophils Relative: 1 %
Eosinophils Absolute: 0.1 10*3/uL (ref 0.0–0.5)
Eosinophils Relative: 1 %
HCT: 37 % (ref 36.0–46.0)
Hemoglobin: 12.7 g/dL (ref 12.0–15.0)
Immature Granulocytes: 0 %
Lymphocytes Relative: 22 %
Lymphs Abs: 1.9 10*3/uL (ref 0.7–4.0)
MCH: 30.2 pg (ref 26.0–34.0)
MCHC: 34.3 g/dL (ref 30.0–36.0)
MCV: 87.9 fL (ref 80.0–100.0)
Monocytes Absolute: 0.7 10*3/uL (ref 0.1–1.0)
Monocytes Relative: 8 %
Neutro Abs: 5.7 10*3/uL (ref 1.7–7.7)
Neutrophils Relative %: 68 %
Platelets: 353 10*3/uL (ref 150–400)
RBC: 4.21 MIL/uL (ref 3.87–5.11)
RDW: 13.3 % (ref 11.5–15.5)
WBC: 8.4 10*3/uL (ref 4.0–10.5)
nRBC: 0 % (ref 0.0–0.2)

## 2023-04-27 LAB — COMPREHENSIVE METABOLIC PANEL
ALT: 19 U/L (ref 0–44)
AST: 18 U/L (ref 15–41)
Albumin: 3.8 g/dL (ref 3.5–5.0)
Alkaline Phosphatase: 59 U/L (ref 38–126)
Anion gap: 10 (ref 5–15)
BUN: 14 mg/dL (ref 6–20)
CO2: 23 mmol/L (ref 22–32)
Calcium: 7.8 mg/dL — ABNORMAL LOW (ref 8.9–10.3)
Chloride: 102 mmol/L (ref 98–111)
Creatinine, Ser: 0.64 mg/dL (ref 0.44–1.00)
GFR, Estimated: >60 mL/min (ref >60)
Glucose, Bld: 93 mg/dL (ref 70–99)
Potassium: 3.5 mmol/L (ref 3.5–5.1)
Sodium: 135 mmol/L (ref 135–145)
Total Bilirubin: 0.5 mg/dL (ref 0.3–1.2)
Total Protein: 7.2 g/dL (ref 6.5–8.1)

## 2023-04-27 LAB — LIPASE, BLOOD: Lipase: 24 U/L (ref 11–51)

## 2023-04-27 LAB — LACTIC ACID, PLASMA: Lactic Acid, Venous: 0.9 mmol/L (ref 0.5–1.9)

## 2023-04-27 MED ORDER — IOHEXOL 300 MG/ML  SOLN
100.0000 mL | Freq: Once | INTRAMUSCULAR | Status: AC | PRN
  Administered 2023-04-28: 100 mL via INTRAVENOUS

## 2023-04-27 MED ORDER — HYDROMORPHONE HCL 1 MG/ML IJ SOLN
1.0000 mg | Freq: Once | INTRAMUSCULAR | Status: AC
  Administered 2023-04-27: 1 mg via INTRAVENOUS
  Filled 2023-04-27: qty 1

## 2023-04-27 NOTE — ED Notes (Signed)
Patient called x 1 for imaging, no answer.
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
## 2023-04-27 NOTE — ED Provider Notes (Signed)
[place] EMERGENCY DEPARTMENT AT [facility] Provider Note   CSN: [id number] Arrival date & time: 04/27/23  1834     History  Chief Complaint  Patient presents with   Fall   Rib Injury    Dawn Morton is a 42 y.o. female.  The history is provided by the patient and medical records. No language interpreter was used.  Fall This is a new problem. The current episode started 12 to 24 hours ago. The problem occurs constantly. The problem has not changed since onset.Associated symptoms include chest pain, abdominal pain and shortness of breath. Pertinent negatives include no headaches. The symptoms are aggravated by twisting. Nothing relieves the symptoms. She has tried nothing for the symptoms. The treatment provided no relief.       Home Medications Prior to Admission medications   Medication Sig Start Date End Date Taking? Authorizing Provider  amoxicillin-clavulanate (AUGMENTIN) 875-125 MG tablet Take 1 tablet by mouth 2 (two) times daily. One po bid x 7 days 06/30/17   Street, [place], [place]  Aspirin-Acetaminophen-Caffeine (EXCEDRIN PO) Take 2 tablets by mouth every 6 (six) hours as needed (pain).    [provider]  fluticasone (FLONASE) 50 MCG/ACT nasal spray Place 2 sprays into both nostrils daily. 06/30/17   Street, [place], PA-C  HYDROcodone-acetaminophen (NORCO/VICODIN) 5-325 MG tablet Take 2 tablets by mouth every 4 (four) hours as needed. 12/24/22   Marita Kansas, PA-C  ibuprofen (ADVIL,MOTRIN) 200 MG tablet Take 800 mg by mouth every 6 (six) hours as needed for headache or mild pain.    [provider]  ibuprofen (ADVIL,MOTRIN) 600 MG tablet Take 1 tablet (600 mg total) by mouth every 6 (six) hours as needed. Patient not taking: Reported on 06/29/2017 02/10/16   Joycie Peek, PA-C  metoCLOPramide (REGLAN) 10 MG tablet Take 1 tablet (10 mg total) by mouth every 6 (six) hours as needed for nausea (nausea/headache). 06/30/17   Street,  [place], PA-C  ondansetron (ZOFRAN) 4 MG tablet Take 1 tablet (4 mg total) by mouth every 6 (six) hours. Patient not taking: Reported on 06/29/2017 02/10/16   Joycie Peek, PA-C  ondansetron (ZOFRAN-ODT) 4 MG disintegrating tablet Take 1 tablet (4 mg total) by mouth every 8 (eight) hours as needed. 12/24/22   Marita Kansas, PA-C  phenazopyridine (PYRIDIUM) 200 MG tablet Take 1 tablet (200 mg total) by mouth 3 (three) times daily as needed for pain. Patient not taking: Reported on 06/29/2017 11/07/16   Garlon Hatchet, PA-C  tamsulosin (FLOMAX) 0.4 MG CAPS capsule Take 1 capsule (0.4 mg total) by mouth daily. 12/24/22   Marita Kansas, PA-C      Allergies    Penicillins    Review of Systems   Review of Systems  Constitutional:  Negative for chills, fatigue and fever.  HENT:  Negative for congestion.   Eyes:  Negative for visual disturbance.  Respiratory:  Positive for shortness of breath. Negative for cough, chest tightness and wheezing.   Cardiovascular:  Positive for chest pain. Negative for palpitations and leg swelling.  Gastrointestinal:  Positive for abdominal pain. Negative for constipation, diarrhea, nausea and vomiting.  Genitourinary:  Positive for flank pain. Negative for dysuria.  Musculoskeletal:  Positive for back pain. Negative for neck pain and neck stiffness.  Skin:  Negative for rash and wound.  Neurological:  Negative for weakness, light-headedness, numbness and headaches.  Psychiatric/Behavioral:  Negative for agitation and confusion.   All other systems reviewed and are negative.   Physical Exam Updated  Vital Signs BP (!) 143/81   Pulse 80   Temp 98 F (36.7 C) (Oral)   Resp 19   Ht 5\' 1"  (1.549 m)   Wt 81.6 kg   SpO2 96%   BMI 34.01 kg/m  Physical Exam Vitals and nursing note reviewed.  Constitutional:      General: She is not in acute distress.    Appearance: She is well-developed. She is not ill-appearing, toxic-appearing or diaphoretic.  HENT:     Head:  Normocephalic and atraumatic.     Nose: No congestion or rhinorrhea.     Mouth/Throat:     Mouth: Mucous membranes are moist.  Eyes:     Extraocular Movements: Extraocular movements intact.     Conjunctiva/sclera: Conjunctivae normal.  Cardiovascular:     Rate and Rhythm: Normal rate and regular rhythm.     Heart sounds: No murmur heard. Pulmonary:     Effort: Pulmonary effort is normal. No respiratory distress.     Breath sounds: Normal breath sounds. No wheezing, rhonchi or rales.  Chest:     Chest wall: Tenderness present.    Abdominal:     General: Abdomen is flat.     Palpations: Abdomen is soft.     Tenderness: There is abdominal tenderness in the epigastric area, periumbilical area, left upper quadrant and left lower quadrant.    Musculoskeletal:        General: Tenderness present. No swelling.     Cervical back: Neck supple. No tenderness.     Thoracic back: Tenderness present.     Lumbar back: Tenderness present.       Back:     Comments: Tenderness to entire left torso in the chest, abdomen, and back.  Also tenderness in the left hand but no snuffbox tenderness or wrist tenderness.  Intact sensation strength and pulses.  No neck tenderness.  Skin:    General: Skin is warm and dry.     Capillary Refill: Capillary refill takes less than 2 seconds.     Findings: No erythema or rash.  Neurological:     General: No focal deficit present.     Mental Status: She is alert.     Sensory: No sensory deficit.     Motor: No weakness.  Psychiatric:        Mood and Affect: Mood normal.     ED Results / Procedures / Treatments   Labs (all labs ordered are listed, but only abnormal results are displayed) Labs Reviewed  COMPREHENSIVE METABOLIC PANEL - Abnormal; Notable for the following components:      Result Value   Calcium 7.8 (*)    All other components within normal limits  CBC WITH DIFFERENTIAL/PLATELET  LACTIC ACID, PLASMA  LIPASE, BLOOD  LACTIC ACID, PLASMA   URINALYSIS, ROUTINE W REFLEX MICROSCOPIC    EKG None  Radiology DG Ribs Unilateral W/Chest Left  Result Date: 04/27/2023 CLINICAL DATA:  Fall, left chest pain EXAM: LEFT RIBS AND CHEST - 3+ VIEW COMPARISON:  None Available. FINDINGS: No fracture or other bone lesions are seen involving the ribs. There is no evidence of pneumothorax or pleural effusion. Both lungs are clear. Heart size and mediastinal contours are within normal limits. IMPRESSION: Negative. Electronically Signed   By: Helyn Numbers M.D.   On: 04/27/2023 22:16    Procedures Procedures    Medications Ordered in ED Medications  iohexol (OMNIPAQUE) 300 MG/ML solution 100 mL (has no administration in time range)  HYDROmorphone (DILAUDID) injection 1  mg (1 mg Intravenous Given 04/27/23 2226)    ED Course/ Medical Decision Making/ A&P                                 Medical Decision Making Amount and/or Complexity of Data Reviewed Labs: ordered. Radiology: ordered.  Risk Prescription drug management.    Dawn Morton is a 42 y.o. female with a past medical history significant for kidney stones who presents with left-sided injuries after fall this morning.  Patient reports at 7 AM she was standing on a tall barstool when it slipped out from under her and she landed on it directly.  She did hit her head but is denying headache or neck pain.  She reports significant pleuritic pain in her left chest, left back, and left abdomen.  It has been persistent throughout the day and is now 10 out of 10.  She reports she cannot take a deep breath without hurting and has never had a history of rib fractures or other injuries from traumatic standpoint.  Denies any nausea, vomiting, constipation, diarrhea, or urinary changes.  Denies any pain in extremities aside from some pain in her hand but she is not worried about it.  On exam, left hand is slightly swollen but she does not have snuffbox tenderness or wrist tenderness.  She  did not want x-ray of her hand although it was offered.  Good pulses in all extremities.  Intact sensation and strength in extremities.  Symmetric smile.  Clear speech.  Pupils symmetric and reactive with normal extract movements.  Patient has significant tenderness in her central and left chest as well as her left abdomen.  Bowel sounds are appreciated.  Breath sounds were symmetric.  Left back and mid back is tender to palpation as well.  No rash or lacerations seen.  Clinically I am concerned about significant traumatic injuries.  She had a chest x-ray in triage that was negative however I am concerned about possible occult rib fractures, pulmonary contusion, or even solid organ injury in the abdomen.  Will get labs and will order CT of the chest/abdomen/pelvis.  She did not want imaging of her hand and she is not having a headache and does not want head imaging.  Will give IV pain medicine and will keep her n.p.o.  Anticipate reassessment after traumatic imaging and labs.      Care transferred to oncoming team to await results of CT imaging and reassessment.         Final Clinical Impression(s) / ED Diagnoses Final diagnoses:  Fall in home, initial encounter  Left-sided chest pain  Left sided abdominal pain  Acute left-sided back pain, unspecified back location    Clinical Impression: 1. Fall in home, initial encounter   2. Left-sided chest pain   3. Left sided abdominal pain   4. Acute left-sided back pain, unspecified back location     Disposition: Care transferred to oncoming team to await reassessment after CT imaging.  This note was prepared with assistance of Conservation officer, historic buildings. Occasional wrong-word or sound-a-like substitutions may have occurred due to the inherent limitations of voice recognition software.     Eran Mistry, Canary Brim, MD 04/27/23 2325

## 2023-04-28 LAB — URINALYSIS, ROUTINE W REFLEX MICROSCOPIC
Bilirubin Urine: NEGATIVE
Glucose, UA: NEGATIVE mg/dL
Hgb urine dipstick: NEGATIVE
Ketones, ur: 40 mg/dL — AB
Leukocytes,Ua: NEGATIVE
Nitrite: NEGATIVE
Protein, ur: NEGATIVE mg/dL
Specific Gravity, Urine: 1.02 (ref 1.005–1.030)
pH: 6 (ref 5.0–8.0)

## 2023-04-28 NOTE — ED Provider Notes (Signed)
Received patient in turnover from Dr. Rush Landmark.  Please see their note for further details of Hx, PE.  Briefly patient is a 42 y.o. female with a Fall and Rib Injury .  Patient did fall off a stool and landed on it.  Complaining of severe left rib pain.  Awaiting CT imaging to assess for occult rib fracture versus intrathoracic or abdominal pathology.  This is resulted and is negative.  Will discharge the patient home.  PCP follow-up.Adela Lank, Jesusita Oka, DO 04/28/23 0129

## 2023-04-28 NOTE — Discharge Instructions (Signed)
Take 4 over the counter ibuprofen tablets 3 times a day or 2 over-the-counter naproxen tablets twice a day for pain. Also take tylenol 1000mg(2 extra strength) four times a day.    

## 2023-11-01 ENCOUNTER — Emergency Department (HOSPITAL_BASED_OUTPATIENT_CLINIC_OR_DEPARTMENT_OTHER)

## 2023-11-01 ENCOUNTER — Emergency Department (HOSPITAL_BASED_OUTPATIENT_CLINIC_OR_DEPARTMENT_OTHER)
Admission: EM | Admit: 2023-11-01 | Discharge: 2023-11-01 | Disposition: A | Discharge status: Home / Self Care | Attending: Emergency Medicine | Admitting: Emergency Medicine

## 2023-11-01 ENCOUNTER — Other Ambulatory Visit: Payer: Self-pay

## 2023-11-01 ENCOUNTER — Encounter (HOSPITAL_BASED_OUTPATIENT_CLINIC_OR_DEPARTMENT_OTHER): Payer: Self-pay | Admitting: Emergency Medicine

## 2023-11-01 DIAGNOSIS — J029 Acute pharyngitis, unspecified: Secondary | ICD-10-CM | POA: Diagnosis not present

## 2023-11-01 DIAGNOSIS — R6883 Chills (without fever): Secondary | ICD-10-CM | POA: Diagnosis not present

## 2023-11-01 DIAGNOSIS — R109 Unspecified abdominal pain: Secondary | ICD-10-CM | POA: Diagnosis present

## 2023-11-01 LAB — CBC WITH DIFFERENTIAL/PLATELET
Abs Immature Granulocytes: 0.04 10*3/uL (ref 0.00–0.07)
Basophils Absolute: 0.1 10*3/uL (ref 0.0–0.1)
Basophils Relative: 1 %
Eosinophils Absolute: 0.1 10*3/uL (ref 0.0–0.5)
Eosinophils Relative: 1 %
HCT: 42.3 % (ref 36.0–46.0)
Hemoglobin: 14.6 g/dL (ref 12.0–15.0)
Immature Granulocytes: 0 %
Lymphocytes Relative: 22 %
Lymphs Abs: 2 10*3/uL (ref 0.7–4.0)
MCH: 30 pg (ref 26.0–34.0)
MCHC: 34.5 g/dL (ref 30.0–36.0)
MCV: 86.9 fL (ref 80.0–100.0)
Monocytes Absolute: 0.7 10*3/uL (ref 0.1–1.0)
Monocytes Relative: 8 %
Neutro Abs: 6.1 10*3/uL (ref 1.7–7.7)
Neutrophils Relative %: 68 %
Platelets: 399 10*3/uL (ref 150–400)
RBC: 4.87 MIL/uL (ref 3.87–5.11)
RDW: 13.2 % (ref 11.5–15.5)
WBC: 9.1 10*3/uL (ref 4.0–10.5)
nRBC: 0 % (ref 0.0–0.2)

## 2023-11-01 LAB — URINALYSIS, ROUTINE W REFLEX MICROSCOPIC
Bilirubin Urine: NEGATIVE
Glucose, UA: NEGATIVE mg/dL
Hgb urine dipstick: NEGATIVE
Ketones, ur: NEGATIVE mg/dL
Leukocytes,Ua: NEGATIVE
Nitrite: NEGATIVE
Protein, ur: 30 mg/dL — AB
Specific Gravity, Urine: 1.025 (ref 1.005–1.030)
pH: 6 (ref 5.0–8.0)

## 2023-11-01 LAB — RESP PANEL BY RT-PCR (RSV, FLU A&B, COVID)  RVPGX2
Influenza A by PCR: NEGATIVE
Influenza B by PCR: NEGATIVE
Resp Syncytial Virus by PCR: NEGATIVE
SARS Coronavirus 2 by RT PCR: NEGATIVE

## 2023-11-01 LAB — BASIC METABOLIC PANEL WITH GFR
Anion gap: 12 (ref 5–15)
BUN: 11 mg/dL (ref 6–20)
CO2: 22 mmol/L (ref 22–32)
Calcium: 8.8 mg/dL — ABNORMAL LOW (ref 8.9–10.3)
Chloride: 105 mmol/L (ref 98–111)
Creatinine, Ser: 0.7 mg/dL (ref 0.44–1.00)
GFR, Estimated: >60 mL/min (ref >60)
Glucose, Bld: 97 mg/dL (ref 70–99)
Potassium: 3.6 mmol/L (ref 3.5–5.1)
Sodium: 139 mmol/L (ref 135–145)

## 2023-11-01 LAB — URINALYSIS, MICROSCOPIC (REFLEX)

## 2023-11-01 LAB — GROUP A STREP BY PCR: Group A Strep by PCR: NOT DETECTED

## 2023-11-01 MED ORDER — LACTATED RINGERS IV BOLUS
1000.0000 mL | Freq: Once | INTRAVENOUS | Status: AC
  Administered 2023-11-01: 1000 mL via INTRAVENOUS

## 2023-11-01 MED ORDER — IOHEXOL 300 MG/ML  SOLN
100.0000 mL | Freq: Once | INTRAMUSCULAR | Status: AC | PRN
  Administered 2023-11-01: 100 mL via INTRAVENOUS

## 2023-11-01 MED ORDER — ONDANSETRON HCL 4 MG/2ML IJ SOLN
4.0000 mg | Freq: Once | INTRAMUSCULAR | Status: AC
  Administered 2023-11-01: 4 mg via INTRAVENOUS
  Filled 2023-11-01: qty 2

## 2023-11-01 MED ORDER — MORPHINE SULFATE (PF) 4 MG/ML IV SOLN
6.0000 mg | Freq: Once | INTRAVENOUS | Status: AC
  Administered 2023-11-01: 6 mg via INTRAVENOUS
  Filled 2023-11-01: qty 2

## 2023-11-01 NOTE — ED Triage Notes (Signed)
 C/o R Flank pain this morning. Hx of kidney stones. Also endorses body aches and a sore throat.

## 2023-11-01 NOTE — ED Provider Notes (Signed)
 [facility] Provider Note   CSN: [id number] Arrival date & time: 11/01/23  1810     History Chief Complaint  Patient presents with   Flank Pain    HPI Dawn Morton is a 43 y.o. female presenting for chief complaint of flank pain and sore throat. Flank pain since last night, feels like kidney stones similar to prior. Endorses chills but no fever. Decreased UOP and feelings of urgency  Sore throat as well. States that it started hurting last night.   Patient's recorded medical, surgical, social, medication list and allergies were reviewed in the Snapshot window as part of the initial history.   Review of Systems   Review of Systems  Constitutional:  Positive for fatigue. Negative for chills and fever.  HENT:  Positive for congestion and sore throat. Negative for ear pain.   Eyes:  Negative for pain and visual disturbance.  Respiratory:  Negative for cough and shortness of breath.   Cardiovascular:  Negative for chest pain and palpitations.  Gastrointestinal:  Negative for abdominal pain and vomiting.  Genitourinary:  Positive for flank pain. Negative for dysuria and hematuria.  Musculoskeletal:  Negative for arthralgias and back pain.  Skin:  Negative for color change and rash.  Neurological:  Negative for seizures and syncope.  All other systems reviewed and are negative.   Physical Exam Updated Vital Signs BP (!) 167/102   Pulse (!) 125   Temp 98.3 F (36.8 C)   Resp 16   SpO2 99%  Physical Exam Vitals and nursing note reviewed.  Constitutional:      General: She is not in acute distress.    Appearance: She is well-developed.  HENT:     Head: Normocephalic and atraumatic.  Eyes:     Conjunctiva/sclera: Conjunctivae normal.  Cardiovascular:     Rate and Rhythm: Normal rate and regular rhythm.     Heart sounds: No murmur heard. Pulmonary:     Effort: Pulmonary effort is normal. No respiratory distress.      Breath sounds: Normal breath sounds.  Abdominal:     General: There is no distension.     Palpations: Abdomen is soft.     Tenderness: There is abdominal tenderness. There is no right CVA tenderness or left CVA tenderness.  Musculoskeletal:        General: No swelling or tenderness. Normal range of motion.     Cervical back: Neck supple.  Skin:    General: Skin is warm and dry.  Neurological:     General: No focal deficit present.     Mental Status: She is alert and oriented to person, place, and time. Mental status is at baseline.     Cranial Nerves: No cranial nerve deficit.      ED Course/ Medical Decision Making/ A&P    Procedures Procedures   Medications Ordered in ED Medications  morphine (PF) 4 MG/ML injection 6 mg (6 mg Intravenous Given 11/01/23 1847)  lactated ringers bolus 1,000 mL (0 mLs Intravenous Stopped 11/01/23 1946)  iohexol (OMNIPAQUE) 300 MG/ML solution 100 mL (100 mLs Intravenous Contrast Given 11/01/23 1911)  ondansetron (ZOFRAN) injection 4 mg (4 mg Intravenous Given 11/01/23 2020)   Medical Decision Making:   Dawn Morton is a 43 y.o. female who presented to the ED today with left sided flank pain, detailed above.    Patient placed on continuous vitals and telemetry monitoring while in ED which was reviewed periodically.  Complete initial physical  exam performed, notably the patient  was HDS in NAD.     Reviewed and confirmed nursing documentation for past medical history, family history, social history.    Initial Assessment:   With the patient's presentation of flank pain, most likely diagnosis is Urologic pathology (including UL vs UTI vs pyelo) vs MSK etiology. Other diagnoses were considered including (but not limited to) gastroenteritis, colitis, small bowel obstruction, appendicitis, cholecystitis, pancreatitis,  ovarian torsion. These are considered less likely due to history of present illness and physical exam findings.   This is most  consistent with an acute life/limb threatening illness complicated by underlying chronic conditions.   Initial Plan:  CBC/ Metabolic Panel  to evaluate for underlying infectious/metabolic etiology for patient's abdominal pain  Lipase to evaluate for pancreatitis  EKG to evaluate for cardiac source of pain  CT Ab/pelvis with contrast due to favored urologic over GI etiology for patient's abdominal pain  Urinalysis and repeat physical assessment to evaluate for UTI/Pyelonpehritis  Empiric management of symptoms with escalating pain control and antiemetics as needed.   Initial Study Results:   Laboratory  All laboratory results reviewed without evidence of clinically relevant pathology.    EKG EKG was reviewed independently. Rate, rhythm, axis, intervals all examined and without medically relevant abnormality. ST segments without concerns for elevations.    Radiology All images reviewed independently. Agree with radiology report at this time.   CT ABDOMEN PELVIS W CONTRAST Result Date: 11/01/2023 CLINICAL DATA:  Right flank pain. EXAM: CT ABDOMEN AND PELVIS WITH CONTRAST TECHNIQUE: Multidetector CT imaging of the abdomen and pelvis was performed using the standard protocol following bolus administration of intravenous contrast. RADIATION DOSE REDUCTION: This exam was performed according to the departmental dose-optimization program which includes automated exposure control, adjustment of the mA and/or kV according to patient size and/or use of iterative reconstruction technique. CONTRAST:  OMNIPAQUE IOHEXOL 300 MG/ML  SOLN COMPARISON:  April 27, 2023 FINDINGS: Lower chest: No acute abnormality. Hepatobiliary: There is diffuse fatty infiltration of the liver parenchyma. No focal liver abnormality is seen. No gallstones, gallbladder wall thickening, or biliary dilatation. Pancreas: Unremarkable. No pancreatic ductal dilatation or surrounding inflammatory changes. Spleen: Normal in size without  focal abnormality. Adrenals/Urinary Tract: Adrenal glands are unremarkable. Kidneys are normal in size, without obstructing renal calculi, focal lesion, or hydronephrosis. 2 mm and 4 mm nonobstructing renal calculi are seen within the lower pole of the right kidney. 3 mm nonobstructing renal calculi are seen within the upper pole of the left kidney. Bladder is unremarkable. Stomach/Bowel: Stomach is within normal limits. The appendix is not visualized. No evidence of bowel wall thickening, distention, or inflammatory changes. Vascular/Lymphatic: No significant vascular findings are present. No enlarged abdominal or pelvic lymph nodes. Reproductive: Status post hysterectomy. There is a stable 1.8 cm diameter simple right adnexal cyst. The left adnexa is unremarkable. Other: No abdominal wall hernia or abnormality. No abdominopelvic ascites. Musculoskeletal: No acute or significant osseous findings. IMPRESSION: 1. Bilateral subcentimeter nonobstructing renal calculi. 2. Hepatic steatosis. 3. Stable 1.8 cm diameter simple right adnexal cyst. No follow-up imaging is recommended. This recommendation follows ACR consensus guidelines: White Paper of the ACR Incidental Findings Committee II on Adnexal Findings. J Am Coll Radiol 2013:10:675-681. 4. Evidence of prior hysterectomy. Electronically Signed   By: Aram Candela M.D.   On: 11/01/2023 21:51   Final Reassessment and Plan:   Patient's history of present illness and physical exam findings are not consistent with any acute pathology at this  time. Heart rate returned to normal after IV fluid.  Pain well-controlled after initial dose of medication, Zofran improved patient's nausea. Ultimately nonspecific syndrome.  No intraoral pathology causing infection, no intra-abdominal pathology.  She is ambulatory tolerating p.o. intake now.  Shared medical decision making with patient regarding accepted.  Could continue observation but after 4 hours here patient is very  motivated to be discharged and follow-up outpatient given resolution of all symptoms. Strict return precautions regarding clinical overlap with a similar condition such as intermittent torsion reinforced and patient expressed understanding.  Disposition:  I have considered need for hospitalization, however, considering all of the above, I believe this patient is stable for discharge at this time.  Patient/family educated about specific return precautions for given chief complaint and symptoms.  Patient/family educated about follow-up with PCP.     Patient/family expressed understanding of return precautions and need for follow-up. Patient spoken to regarding all imaging and laboratory results and appropriate follow up for these results. All education provided in verbal form with additional information in written form. Time was allowed for answering of patient questions. Patient discharged.    Emergency Department Medication Summary:   Medications  morphine (PF) 4 MG/ML injection 6 mg (6 mg Intravenous Given 11/01/23 1847)  lactated ringers bolus 1,000 mL (0 mLs Intravenous Stopped 11/01/23 1946)  iohexol (OMNIPAQUE) 300 MG/ML solution 100 mL (100 mLs Intravenous Contrast Given 11/01/23 1911)  ondansetron (ZOFRAN) injection 4 mg (4 mg Intravenous Given 11/01/23 2020)            Clinical Impression:  1. Flank pain      Discharge   Final Clinical Impression(s) / ED Diagnoses Final diagnoses:  Flank pain    Rx / DC Orders ED Discharge Orders     None         Glyn Ade, MD 11/01/23 2213

## 2024-01-22 ENCOUNTER — Other Ambulatory Visit: Payer: Self-pay

## 2024-01-22 ENCOUNTER — Encounter (HOSPITAL_BASED_OUTPATIENT_CLINIC_OR_DEPARTMENT_OTHER): Payer: Self-pay

## 2024-01-22 ENCOUNTER — Emergency Department (HOSPITAL_BASED_OUTPATIENT_CLINIC_OR_DEPARTMENT_OTHER)
Admission: EM | Admit: 2024-01-22 | Discharge: 2024-01-22 | Disposition: A | Discharge status: Home / Self Care | Attending: Emergency Medicine | Admitting: Emergency Medicine

## 2024-01-22 ENCOUNTER — Emergency Department (HOSPITAL_BASED_OUTPATIENT_CLINIC_OR_DEPARTMENT_OTHER)

## 2024-01-22 DIAGNOSIS — Z7982 Long term (current) use of aspirin: Secondary | ICD-10-CM | POA: Diagnosis not present

## 2024-01-22 DIAGNOSIS — R059 Cough, unspecified: Secondary | ICD-10-CM | POA: Diagnosis present

## 2024-01-22 DIAGNOSIS — J069 Acute upper respiratory infection, unspecified: Secondary | ICD-10-CM | POA: Diagnosis not present

## 2024-01-22 DIAGNOSIS — R062 Wheezing: Secondary | ICD-10-CM | POA: Insufficient documentation

## 2024-01-22 HISTORY — DX: Depression, unspecified: F32.A

## 2024-01-22 HISTORY — DX: Attention-deficit hyperactivity disorder, unspecified type: F90.9

## 2024-01-22 LAB — RESP PANEL BY RT-PCR (RSV, FLU A&B, COVID)  RVPGX2
Influenza A by PCR: NEGATIVE
Influenza B by PCR: NEGATIVE
Resp Syncytial Virus by PCR: NEGATIVE
SARS Coronavirus 2 by RT PCR: NEGATIVE

## 2024-01-22 MED ORDER — PREDNISONE 50 MG PO TABS
60.0000 mg | ORAL_TABLET | Freq: Once | ORAL | Status: AC
  Administered 2024-01-22: 60 mg via ORAL
  Filled 2024-01-22: qty 1

## 2024-01-22 MED ORDER — ALBUTEROL SULFATE HFA 108 (90 BASE) MCG/ACT IN AERS: 1.0000 | INHALATION_SPRAY | Freq: Four times a day (QID) | RESPIRATORY_TRACT | 0 refills | Status: AC | PRN

## 2024-01-22 MED ORDER — ALBUTEROL SULFATE HFA 108 (90 BASE) MCG/ACT IN AERS
1.0000 | INHALATION_SPRAY | Freq: Four times a day (QID) | RESPIRATORY_TRACT | 0 refills | Status: DC | PRN
Start: 2024-01-22 — End: 2024-01-22

## 2024-01-22 MED ORDER — IPRATROPIUM-ALBUTEROL 0.5-2.5 (3) MG/3ML IN SOLN: 3.0000 mL | RESPIRATORY_TRACT | 1 refills | Status: DC | PRN

## 2024-01-22 MED ORDER — IPRATROPIUM-ALBUTEROL 0.5-2.5 (3) MG/3ML IN SOLN
3.0000 mL | Freq: Once | RESPIRATORY_TRACT | Status: AC
  Administered 2024-01-22: 3 mL via RESPIRATORY_TRACT
  Filled 2024-01-22: qty 3

## 2024-01-22 MED ORDER — PREDNISONE 10 MG (21) PO TBPK: ORAL_TABLET | Freq: Every day | ORAL | 0 refills | Status: DC

## 2024-01-22 MED ORDER — PREDNISONE 10 MG (21) PO TBPK: ORAL_TABLET | Freq: Every day | ORAL | 0 refills | Status: AC

## 2024-01-22 MED ORDER — IPRATROPIUM-ALBUTEROL 0.5-2.5 (3) MG/3ML IN SOLN: 3.0000 mL | RESPIRATORY_TRACT | 1 refills | Status: AC | PRN

## 2024-01-22 MED ORDER — ALBUTEROL SULFATE HFA 108 (90 BASE) MCG/ACT IN AERS
2.0000 | INHALATION_SPRAY | Freq: Once | RESPIRATORY_TRACT | Status: AC
  Administered 2024-01-22: 2 via RESPIRATORY_TRACT
  Filled 2024-01-22: qty 6.7

## 2024-01-22 NOTE — Discharge Instructions (Signed)
 Today you were seen for what is likely a viral URI.  You are found to be wheezing while in the emergency department.  Please pick up your prednisone taper and take as prescribed.  You may also take your inhaler as prescribed for shortness of breath.  You may take Tylenol  Motrin  as needed for pain and fever.  Thank you for letting us  treat you today. After reviewing your labs and imaging, I feel you are safe to go home. Please follow up with your PCP in the next several days and provide them with your records from this visit. Return to the Emergency Room if pain becomes severe or symptoms worsen.

## 2024-01-22 NOTE — ED Triage Notes (Signed)
 Fevers, cough and flu-like symptoms started Friday.  OTC meds used with minimal relief

## 2024-01-22 NOTE — ED Provider Notes (Signed)
 [place] [facility] AT [facility] Provider Note   CSN: [id number] Arrival date & time: 01/22/24  2032     Patient presents with: Cough   Dawn Morton is a 43 y.o. female past medical history significant for kidney stones and anemia presents today for flulike symptoms that began on Friday.  Patient reports fever, cough, body aches, chills, fatigue, congestion and headache.  Patient has been taking over-the-counter medications without relief robutussin and Alka-Seltzer cold.  Patient denies nausea, vomiting, shortness of breath, chest pain, diarrhea, constipation, or abdominal pain.    Cough      Prior to Admission medications   Medication Sig Start Date End Date Taking? Authorizing Provider  albuterol  (VENTOLIN  HFA) 108 (90 Base) MCG/ACT inhaler Inhale 1-2 puffs into the lungs every 6 (six) hours as needed for wheezing or shortness of breath. 01/22/24   Terriah Reggio N, PA-C  amoxicillin -clavulanate (AUGMENTIN ) 875-125 MG tablet Take 1 tablet by mouth 2 (two) times daily. One po bid x 7 days 06/30/17   Street, Gallant, PA-C  Aspirin-Acetaminophen -Caffeine (EXCEDRIN PO) Take 2 tablets by mouth every 6 (six) hours as needed (pain).    [provider]  fluticasone  (FLONASE ) 50 MCG/ACT nasal spray Place 2 sprays into both nostrils daily. 06/30/17   Street, Bernie, PA-C  HYDROcodone -acetaminophen  (NORCO/VICODIN) 5-325 MG tablet Take 2 tablets by mouth every 4 (four) hours as needed. 12/24/22   Hildegard Loge, PA-C  ibuprofen  (ADVIL ,MOTRIN ) 200 MG tablet Take 800 mg by mouth every 6 (six) hours as needed for headache or mild pain.    [provider]  ibuprofen  (ADVIL ,MOTRIN ) 600 MG tablet Take 1 tablet (600 mg total) by mouth every 6 (six) hours as needed. Patient not taking: Reported on 06/29/2017 02/10/16   Arlinda Katz, PA-C  ipratropium-albuterol  (DUONEB) 0.5-2.5 (3) MG/3ML SOLN Take 3 mLs by nebulization every 4 (four) hours as needed. 01/22/24    Ishi Danser N, PA-C  metoCLOPramide  (REGLAN ) 10 MG tablet Take 1 tablet (10 mg total) by mouth every 6 (six) hours as needed for nausea (nausea/headache). 06/30/17   Street, Mercedes, PA-C  ondansetron  (ZOFRAN ) 4 MG tablet Take 1 tablet (4 mg total) by mouth every 6 (six) hours. Patient not taking: Reported on 06/29/2017 02/10/16   Arlinda Katz, PA-C  ondansetron  (ZOFRAN -ODT) 4 MG disintegrating tablet Take 1 tablet (4 mg total) by mouth every 8 (eight) hours as needed. 12/24/22   Hildegard Loge, PA-C  phenazopyridine  (PYRIDIUM ) 200 MG tablet Take 1 tablet (200 mg total) by mouth 3 (three) times daily as needed for pain. Patient not taking: Reported on 06/29/2017 11/07/16   Jarold Olam HERO, PA-C  predniSONE  (STERAPRED UNI-PAK 21 TAB) 10 MG (21) TBPK tablet Take by mouth daily. Take 6 tabs by mouth daily  for 2 days, then 5 tabs for 2 days, then 4 tabs for 2 days, then 3 tabs for 2 days, 2 tabs for 2 days, then 1 tab by mouth daily for 2 days 01/22/24   Francis Ileana SAILOR, PA-C  tamsulosin  (FLOMAX ) 0.4 MG CAPS capsule Take 1 capsule (0.4 mg total) by mouth daily. 12/24/22   Hildegard Loge, PA-C    Allergies: Penicillins    Review of Systems  Respiratory:  Positive for cough.     Updated Vital Signs BP (!) 172/110 (BP Location: Left Arm)   Pulse 95   Temp 99.3 F (37.4 C) (Oral)   Resp 18   Ht 5' 1 (1.549 m)   Wt 81.6 kg  SpO2 98%   BMI 34.01 kg/m   Physical Exam Vitals and nursing note reviewed.  Constitutional:      General: She is not in acute distress.    Appearance: Normal appearance. She is well-developed. She is not ill-appearing.  HENT:     Head: Normocephalic and atraumatic.     Right Ear: External ear normal.     Left Ear: External ear normal.     Nose: Nose normal.     Mouth/Throat:     Mouth: Mucous membranes are moist.     Pharynx: Oropharynx is clear. No oropharyngeal exudate.  Eyes:     Conjunctiva/sclera: Conjunctivae normal.  Cardiovascular:     Rate and Rhythm: Normal  rate and regular rhythm.     Pulses: Normal pulses.     Heart sounds: Normal heart sounds. No murmur heard. Pulmonary:     Effort: Pulmonary effort is normal. No respiratory distress.     Breath sounds: Wheezing present.     Comments: Scattered wheezing and prolonged expiration in all lobes Abdominal:     Palpations: Abdomen is soft.     Tenderness: There is no abdominal tenderness.  Musculoskeletal:        General: No swelling.     Cervical back: Normal range of motion and neck supple.  Skin:    General: Skin is warm and dry.     Capillary Refill: Capillary refill takes less than 2 seconds.  Neurological:     General: No focal deficit present.     Mental Status: She is alert and oriented to person, place, and time.  Psychiatric:        Mood and Affect: Mood normal.     (all labs ordered are listed, but only abnormal results are displayed) Labs Reviewed  RESP PANEL BY RT-PCR (RSV, FLU A&B, COVID)  [id number]    EKG: None  Radiology: DG Chest 2 View Result Date: 01/22/2024 CLINICAL DATA:  Cough for 4 days with shortness of breath EXAM: CHEST - 2 VIEW COMPARISON:  04/27/2023 FINDINGS: The heart size and mediastinal contours are within normal limits. Both lungs are clear. The visualized skeletal structures are unremarkable. IMPRESSION: No active cardiopulmonary disease. Electronically Signed   By: Norman Gatlin M.D.   On: 01/22/2024 22:20     Procedures   Medications Ordered in the ED  albuterol  (VENTOLIN  HFA) 108 (90 Base) MCG/ACT inhaler 2 puff (2 puffs Inhalation Given 01/22/24 2159)  ipratropium-albuterol  (DUONEB) 0.5-2.5 (3) MG/3ML nebulizer solution 3 mL (3 mLs Nebulization Given 01/22/24 2242)  predniSONE  (DELTASONE ) tablet 60 mg (60 mg Oral Given 01/22/24 2238)                                    Medical Decision Making Amount and/or Complexity of Data Reviewed Radiology: ordered.  Risk Prescription drug management.   This patient presents to the ED for concern of  flulike symptoms differential diagnosis includes COVID, flu, RSV, viral URI    Additional history obtained   Additional history obtained from Electronic Medical Record External records from outside source obtained and reviewed including Care Everywhere   Lab Tests:  I Ordered, and personally interpreted labs.  The pertinent results include: Respiratory panel negative   Imaging Studies ordered:  I ordered imaging studies including chest x-ray I independently visualized and interpreted imaging which showed no active cardiopulmonary disease I agree with the radiologist interpretation   Medicines ordered  and prescription drug management:  I ordered medication including albuterol  inhaler and DuoNeb    I have reviewed the patients home medicines and have made adjustments as needed   Problem List / ED Course:  Reevaluation after receiving DuoNeb, patient's wheezing has improved but is still present along with prolonged expiration.  Patient states that she is feeling much better. Considered for admission or further workup however patient's vital signs, physical exam, labs, and imaging have been reassuring.  Patient's symptoms likely due to viral URI.  Given that the patient was wheezing well in the emergency department the patient has been prescribed an albuterol  inhaler outpatient and a prednisone  taper.  Patient also given DME nebulizer and DuoNebs.  Patient given return precautions.  I feel patient safe for discharge at this time.       Final diagnoses:  Viral URI with cough  Wheezing    ED Discharge Orders          Ordered    albuterol  (VENTOLIN  HFA) 108 (90 Base) MCG/ACT inhaler  Every 6 hours PRN,   Status:  Discontinued        01/22/24 2229    predniSONE  (STERAPRED UNI-PAK 21 TAB) 10 MG (21) TBPK tablet  Daily,   Status:  Discontinued        01/22/24 2229    albuterol  (VENTOLIN  HFA) 108 (90 Base) MCG/ACT inhaler  Every 6 hours PRN        01/22/24 2316    predniSONE   (STERAPRED UNI-PAK 21 TAB) 10 MG (21) TBPK tablet  Daily        01/22/24 2316    For home use only DME Nebulizer machine        01/22/24 2316    ipratropium-albuterol  (DUONEB) 0.5-2.5 (3) MG/3ML SOLN  Every 4 hours PRN,   Status:  Discontinued        01/22/24 2316    ipratropium-albuterol  (DUONEB) 0.5-2.5 (3) MG/3ML SOLN  Every 4 hours PRN        01/22/24 2316               Francis Ileana SAILOR, PA-C 01/22/24 2317    Yolande Lamar BROCKS, MD 01/25/24 [phone]

## 2024-06-16 ENCOUNTER — Emergency Department (HOSPITAL_BASED_OUTPATIENT_CLINIC_OR_DEPARTMENT_OTHER)
Admission: EM | Admit: 2024-06-16 | Discharge: 2024-06-17 | Disposition: A | Discharge status: Home / Self Care | Attending: Emergency Medicine | Admitting: Emergency Medicine

## 2024-06-16 ENCOUNTER — Emergency Department (HOSPITAL_BASED_OUTPATIENT_CLINIC_OR_DEPARTMENT_OTHER)

## 2024-06-16 ENCOUNTER — Other Ambulatory Visit: Payer: Self-pay

## 2024-06-16 ENCOUNTER — Encounter (HOSPITAL_BASED_OUTPATIENT_CLINIC_OR_DEPARTMENT_OTHER): Payer: Self-pay | Admitting: Emergency Medicine

## 2024-06-16 DIAGNOSIS — R10A1 Flank pain, right side: Secondary | ICD-10-CM | POA: Insufficient documentation

## 2024-06-16 DIAGNOSIS — Z7982 Long term (current) use of aspirin: Secondary | ICD-10-CM | POA: Insufficient documentation

## 2024-06-16 LAB — URINALYSIS, MICROSCOPIC (REFLEX): WBC, UA: NONE SEEN WBC/hpf (ref 0–5)

## 2024-06-16 LAB — CBC WITH DIFFERENTIAL/PLATELET
Abs Immature Granulocytes: 0.02 K/uL (ref 0.00–0.07)
Basophils Absolute: 0.1 K/uL (ref 0.0–0.1)
Basophils Relative: 1 %
Eosinophils Absolute: 0.1 K/uL (ref 0.0–0.5)
Eosinophils Relative: 1 %
HCT: 38.3 % (ref 36.0–46.0)
Hemoglobin: 13.4 g/dL (ref 12.0–15.0)
Immature Granulocytes: 0 %
Lymphocytes Relative: 20 %
Lymphs Abs: 1.9 K/uL (ref 0.7–4.0)
MCH: 29.9 pg (ref 26.0–34.0)
MCHC: 35 g/dL (ref 30.0–36.0)
MCV: 85.5 fL (ref 80.0–100.0)
Monocytes Absolute: 0.8 K/uL (ref 0.1–1.0)
Monocytes Relative: 8 %
Neutro Abs: 6.6 K/uL (ref 1.7–7.7)
Neutrophils Relative %: 70 %
Platelets: 423 K/uL — ABNORMAL HIGH (ref 150–400)
RBC: 4.48 MIL/uL (ref 3.87–5.11)
RDW: 13.2 % (ref 11.5–15.5)
WBC: 9.5 K/uL (ref 4.0–10.5)
nRBC: 0 % (ref 0.0–0.2)

## 2024-06-16 LAB — URINALYSIS, ROUTINE W REFLEX MICROSCOPIC
Bilirubin Urine: NEGATIVE
Glucose, UA: NEGATIVE mg/dL
Ketones, ur: 15 mg/dL — AB
Leukocytes,Ua: NEGATIVE
Nitrite: NEGATIVE
Protein, ur: 30 mg/dL — AB
Specific Gravity, Urine: 1.025 (ref 1.005–1.030)
pH: 6 (ref 5.0–8.0)

## 2024-06-16 LAB — BASIC METABOLIC PANEL WITH GFR
Anion gap: 11 (ref 5–15)
BUN: 12 mg/dL (ref 6–20)
CO2: 26 mmol/L (ref 22–32)
Calcium: 8.9 mg/dL (ref 8.9–10.3)
Chloride: 101 mmol/L (ref 98–111)
Creatinine, Ser: 0.93 mg/dL (ref 0.44–1.00)
GFR, Estimated: >60 mL/min (ref >60)
Glucose, Bld: 114 mg/dL — ABNORMAL HIGH (ref 70–99)
Potassium: 3.1 mmol/L — ABNORMAL LOW (ref 3.5–5.1)
Sodium: 138 mmol/L (ref 135–145)

## 2024-06-16 MED ORDER — ONDANSETRON HCL 4 MG/2ML IJ SOLN
4.0000 mg | Freq: Once | INTRAMUSCULAR | Status: AC
  Administered 2024-06-16: 4 mg via INTRAVENOUS
  Filled 2024-06-16: qty 2

## 2024-06-16 MED ORDER — SODIUM CHLORIDE 0.9 % IV BOLUS
1000.0000 mL | Freq: Once | INTRAVENOUS | Status: AC
  Administered 2024-06-16: 1000 mL via INTRAVENOUS

## 2024-06-16 MED ORDER — HYDROMORPHONE HCL 1 MG/ML IJ SOLN
1.0000 mg | Freq: Once | INTRAMUSCULAR | Status: AC
  Administered 2024-06-16: 1 mg via INTRAVENOUS
  Filled 2024-06-16: qty 1

## 2024-06-16 NOTE — Discharge Instructions (Signed)
 Workup here no evidence of any ureteral stones.  No evidence urinary tract infection.  Recommend Exer strength Tylenol  2 tablets every 8 hours.  And Motrin  800 mg every 8.  Return for any new or worse symptoms.

## 2024-06-16 NOTE — ED Provider Notes (Addendum)
 [facility] Provider Note   CSN: [id number] Arrival date & time: 06/16/24  2003     Patient presents with: Flank Pain   Dawn Morton is a 43 y.o. female.   Patient with the onset of right flank pain last evening got suddenly worse today.  Associated with chills dry heaves no fever.  Patient felt fine earlier yesterday.  Patient does have a history of kidney stones had a left-sided kidney stone in June 2024 that was 3 mm in size.  Temp here 98.3 pulse 116 respiratory rate 23 blood pressure of 158/105 oxygen sats 97% on room air past medical significant for anemia kidney stones ADHD depression.  Patient has had an abdominal hysterectomy.       Prior to Admission medications   Medication Sig Start Date End Date Taking? Authorizing Provider  albuterol  (VENTOLIN  HFA) 108 (90 Base) MCG/ACT inhaler Inhale 1-2 puffs into the lungs every 6 (six) hours as needed for wheezing or shortness of breath. 01/22/24   Keith, Kayla N, PA-C  amoxicillin -clavulanate (AUGMENTIN ) 875-125 MG tablet Take 1 tablet by mouth 2 (two) times daily. One po bid x 7 days 06/30/17   Street, [place], PA-C  Aspirin-Acetaminophen -Caffeine (EXCEDRIN PO) Take 2 tablets by mouth every 6 (six) hours as needed (pain).    [provider]  fluticasone  (FLONASE ) 50 MCG/ACT nasal spray Place 2 sprays into both nostrils daily. 06/30/17   Street, [place], PA-C  HYDROcodone -acetaminophen  (NORCO/VICODIN) 5-325 MG tablet Take 2 tablets by mouth every 4 (four) hours as needed. 12/24/22   Hildegard, Amjad, PA-C  ibuprofen  (ADVIL ,MOTRIN ) 200 MG tablet Take 800 mg by mouth every 6 (six) hours as needed for headache or mild pain.    [provider]  ibuprofen  (ADVIL ,MOTRIN ) 600 MG tablet Take 1 tablet (600 mg total) by mouth every 6 (six) hours as needed. Patient not taking: Reported on 06/29/2017 02/10/16   Arlinda Katz, PA-C  ipratropium-albuterol  (DUONEB) 0.5-2.5 (3) MG/3ML  SOLN Take 3 mLs by nebulization every 4 (four) hours as needed. 01/22/24   Keith, Kayla N, PA-C  metoCLOPramide  (REGLAN ) 10 MG tablet Take 1 tablet (10 mg total) by mouth every 6 (six) hours as needed for nausea (nausea/headache). 06/30/17   Street, [place], PA-C  ondansetron  (ZOFRAN ) 4 MG tablet Take 1 tablet (4 mg total) by mouth every 6 (six) hours. Patient not taking: Reported on 06/29/2017 02/10/16   Arlinda Katz, PA-C  ondansetron  (ZOFRAN -ODT) 4 MG disintegrating tablet Take 1 tablet (4 mg total) by mouth every 8 (eight) hours as needed. 12/24/22   Hildegard Loge, PA-C  phenazopyridine  (PYRIDIUM ) 200 MG tablet Take 1 tablet (200 mg total) by mouth 3 (three) times daily as needed for pain. Patient not taking: Reported on 06/29/2017 11/07/16   Jarold Olam HERO, PA-C  predniSONE  (STERAPRED UNI-PAK 21 TAB) 10 MG (21) TBPK tablet Take by mouth daily. Take 6 tabs by mouth daily  for 2 days, then 5 tabs for 2 days, then 4 tabs for 2 days, then 3 tabs for 2 days, 2 tabs for 2 days, then 1 tab by mouth daily for 2 days 01/22/24   Francis Ileana SAILOR, PA-C  tamsulosin  (FLOMAX ) 0.4 MG CAPS capsule Take 1 capsule (0.4 mg total) by mouth daily. 12/24/22   Hildegard Loge, PA-C    Allergies: Penicillins    Review of Systems  Constitutional:  Positive for chills. Negative for fever.  HENT:  Negative for ear pain and sore throat.   Eyes:  Negative for pain and visual disturbance.  Respiratory:  Negative for cough and shortness of breath.   Cardiovascular:  Negative for chest pain and palpitations.  Gastrointestinal:  Negative for abdominal pain and vomiting.  Genitourinary:  Positive for flank pain. Negative for dysuria and hematuria.  Musculoskeletal:  Negative for arthralgias and back pain.  Skin:  Negative for color change and rash.  Neurological:  Negative for seizures and syncope.  All other systems reviewed and are negative.   Updated Vital Signs BP (!) 158/105   Pulse (!) 116   Temp 98.3 F (36.8 C)   Resp  (!) 23   Ht 1.549 m (5' 1)   Wt 79.4 kg   SpO2 97%   BMI 33.07 kg/m   Physical Exam Vitals and nursing note reviewed.  Constitutional:      General: She is not in acute distress.    Appearance: She is well-developed. She is not ill-appearing.  HENT:     Head: Normocephalic and atraumatic.  Eyes:     Extraocular Movements: Extraocular movements intact.     Conjunctiva/sclera: Conjunctivae normal.     Pupils: Pupils are equal, round, and reactive to light.  Cardiovascular:     Rate and Rhythm: Normal rate and regular rhythm.     Heart sounds: No murmur heard. Pulmonary:     Effort: Pulmonary effort is normal. No respiratory distress.     Breath sounds: Normal breath sounds.  Abdominal:     General: There is no distension.     Palpations: Abdomen is soft.     Tenderness: There is no abdominal tenderness. There is no guarding.  Musculoskeletal:        General: No swelling.     Cervical back: Neck supple.  Skin:    General: Skin is warm and dry.     Capillary Refill: Capillary refill takes less than 2 seconds.  Neurological:     General: No focal deficit present.     Mental Status: She is alert and oriented to person, place, and time.  Psychiatric:        Mood and Affect: Mood normal.     (all labs ordered are listed, but only abnormal results are displayed) Labs Reviewed  URINALYSIS, ROUTINE W REFLEX MICROSCOPIC  CBC WITH DIFFERENTIAL/PLATELET  BASIC METABOLIC PANEL WITH GFR    EKG: None  Radiology: No results found.   Procedures   Medications Ordered in the ED  HYDROmorphone  (DILAUDID ) injection 1 mg (has no administration in time range)  ondansetron  (ZOFRAN ) injection 4 mg (has no administration in time range)                                    Medical Decision Making Amount and/or Complexity of Data Reviewed Labs: ordered. Radiology: ordered.  Risk Prescription drug management.   Flank pain reminds patient of previous kidney stone.  This is  on the right side this time.  Will get CT renal will give IV fluids IV pain medicine and antinausea medicine basic labs urinalysis pending.  Patient has had an abdominal hysterectomy.  Labs are still pending.  CT renal study is back.  No acute findings in the abdomen or pelvis bilateral nonobstructing kidney stones.  Based on this we will see what we get on the urinalysis and what we get with labs.  Urinalysis is negative.  CBC white count is 9.5. Renal function is normal.  Potassium a little low at 3.1.  Will treat patient symptomatically with with nonsteroidal anti-inflammatory and extra drink Tylenol  as needed. Final diagnoses:  Right flank pain    ED Discharge Orders     None          Geraldene Hamilton, MD 06/16/24 2042    Geraldene Hamilton, MD 06/16/24 2100    Geraldene Hamilton, MD 06/16/24 2352

## 2024-06-16 NOTE — ED Triage Notes (Signed)
 Pt c/o R flank pain since yesterday, with chills, intermittent urination. Denies known fever.   Hx of kidney stones.

## 2024-08-24 ENCOUNTER — Emergency Department (HOSPITAL_BASED_OUTPATIENT_CLINIC_OR_DEPARTMENT_OTHER)
Admission: EM | Admit: 2024-08-24 | Discharge: 2024-08-24 | Disposition: A | Discharge status: Home / Self Care | Attending: Emergency Medicine | Admitting: Emergency Medicine

## 2024-08-24 ENCOUNTER — Encounter (HOSPITAL_BASED_OUTPATIENT_CLINIC_OR_DEPARTMENT_OTHER): Payer: Self-pay

## 2024-08-24 ENCOUNTER — Emergency Department (HOSPITAL_BASED_OUTPATIENT_CLINIC_OR_DEPARTMENT_OTHER)

## 2024-08-24 ENCOUNTER — Other Ambulatory Visit: Payer: Self-pay

## 2024-08-24 DIAGNOSIS — R5383 Other fatigue: Secondary | ICD-10-CM | POA: Diagnosis not present

## 2024-08-24 DIAGNOSIS — J069 Acute upper respiratory infection, unspecified: Secondary | ICD-10-CM

## 2024-08-24 DIAGNOSIS — R059 Cough, unspecified: Secondary | ICD-10-CM | POA: Insufficient documentation

## 2024-08-24 DIAGNOSIS — R0981 Nasal congestion: Secondary | ICD-10-CM | POA: Diagnosis not present

## 2024-08-24 LAB — CBC
HCT: 38 % (ref 36.0–46.0)
Hemoglobin: 12.7 g/dL (ref 12.0–15.0)
MCH: 29.2 pg (ref 26.0–34.0)
MCHC: 33.4 g/dL (ref 30.0–36.0)
MCV: 87.4 fL (ref 80.0–100.0)
Platelets: 321 10*3/uL (ref 150–400)
RBC: 4.35 MIL/uL (ref 3.87–5.11)
RDW: 13.5 % (ref 11.5–15.5)
WBC: 14 10*3/uL — ABNORMAL HIGH (ref 4.0–10.5)
nRBC: 0 % (ref 0.0–0.2)

## 2024-08-24 LAB — BASIC METABOLIC PANEL WITH GFR
Anion gap: 12 (ref 5–15)
BUN: 7 mg/dL (ref 6–20)
CO2: 23 mmol/L (ref 22–32)
Calcium: 8.3 mg/dL — ABNORMAL LOW (ref 8.9–10.3)
Chloride: 103 mmol/L (ref 98–111)
Creatinine, Ser: 0.62 mg/dL (ref 0.44–1.00)
GFR, Estimated: >60 mL/min
Glucose, Bld: 88 mg/dL (ref 70–99)
Potassium: 3.6 mmol/L (ref 3.5–5.1)
Sodium: 139 mmol/L (ref 135–145)

## 2024-08-24 LAB — GROUP A STREP BY PCR: Group A Strep by PCR: NOT DETECTED

## 2024-08-24 LAB — RESP PANEL BY RT-PCR (RSV, FLU A&B, COVID)  RVPGX2
Influenza A by PCR: NEGATIVE
Influenza B by PCR: NEGATIVE
Resp Syncytial Virus by PCR: NEGATIVE
SARS Coronavirus 2 by RT PCR: NEGATIVE

## 2024-08-24 MED ORDER — ONDANSETRON 4 MG PO TBDP: ORAL_TABLET | ORAL | 0 refills | Status: AC

## 2024-08-24 MED ORDER — AZITHROMYCIN 250 MG PO TABS: 250.0000 mg | ORAL_TABLET | Freq: Every day | ORAL | 0 refills | Status: AC

## 2024-08-24 MED ORDER — BENZONATATE 100 MG PO CAPS: 100.0000 mg | ORAL_CAPSULE | Freq: Three times a day (TID) | ORAL | 0 refills | Status: AC

## 2024-08-24 NOTE — ED Triage Notes (Addendum)
 Cough, congestion, headache, body aches, loss of smell and taste, sore throat for 1 month. States ribs feel sore from cough and SHOB   No obvious signs of respiratory distress in triage

## 2024-08-24 NOTE — ED Provider Notes (Signed)
 " [name] Provider Note   CSN: [id number] Arrival date & time: 08/24/24  1652     Patient presents with: Cough   Dawn Morton is a 44 y.o. female.   44 yo F with a chief complaint of cough congestion fatigue going on for about a month now.  She said that has been pretty persistent though today she noticed that maybe she was coughing up some more greenish colored mucus and felt like her throat was newly sore.  She denies sick contacts initially but when asked if she was around small children told me that her boyfriend has a daughter that has been sick recently.   Cough      Prior to Admission medications  Medication Sig Start Date End Date Taking? Authorizing Provider  albuterol  (VENTOLIN  HFA) 108 (90 Base) MCG/ACT inhaler Inhale 1-2 puffs into the lungs every 6 (six) hours as needed for wheezing or shortness of breath. 01/22/24   Keith, Kayla N, PA-C  amoxicillin -clavulanate (AUGMENTIN ) 875-125 MG tablet Take 1 tablet by mouth 2 (two) times daily. One po bid x 7 days 06/30/17   Street, [place], PA-C  Aspirin-Acetaminophen -Caffeine (EXCEDRIN PO) Take 2 tablets by mouth every 6 (six) hours as needed (pain).    [provider]  fluticasone  (FLONASE ) 50 MCG/ACT nasal spray Place 2 sprays into both nostrils daily. 06/30/17   Street, [place], PA-C  HYDROcodone -acetaminophen  (NORCO/VICODIN) 5-325 MG tablet Take 2 tablets by mouth every 4 (four) hours as needed. 12/24/22   Hildegard, Amjad, PA-C  ibuprofen  (ADVIL ,MOTRIN ) 200 MG tablet Take 800 mg by mouth every 6 (six) hours as needed for headache or mild pain.    [provider]  ibuprofen  (ADVIL ,MOTRIN ) 600 MG tablet Take 1 tablet (600 mg total) by mouth every 6 (six) hours as needed. Patient not taking: Reported on 06/29/2017 02/10/16   Arlinda Katz, PA-C  ipratropium-albuterol  (DUONEB) 0.5-2.5 (3) MG/3ML SOLN Take 3 mLs by nebulization every 4 (four) hours as needed. 01/22/24    Keith, Kayla N, PA-C  metoCLOPramide  (REGLAN ) 10 MG tablet Take 1 tablet (10 mg total) by mouth every 6 (six) hours as needed for nausea (nausea/headache). 06/30/17   Street, [place], PA-C  ondansetron  (ZOFRAN ) 4 MG tablet Take 1 tablet (4 mg total) by mouth every 6 (six) hours. Patient not taking: Reported on 06/29/2017 02/10/16   Arlinda Katz, PA-C  ondansetron  (ZOFRAN -ODT) 4 MG disintegrating tablet Take 1 tablet (4 mg total) by mouth every 8 (eight) hours as needed. 12/24/22   Hildegard Loge, PA-C  phenazopyridine  (PYRIDIUM ) 200 MG tablet Take 1 tablet (200 mg total) by mouth 3 (three) times daily as needed for pain. Patient not taking: Reported on 06/29/2017 11/07/16   Jarold Olam HERO, PA-C  predniSONE  (STERAPRED UNI-PAK 21 TAB) 10 MG (21) TBPK tablet Take by mouth daily. Take 6 tabs by mouth daily  for 2 days, then 5 tabs for 2 days, then 4 tabs for 2 days, then 3 tabs for 2 days, 2 tabs for 2 days, then 1 tab by mouth daily for 2 days 01/22/24   Francis Ileana SAILOR, PA-C  tamsulosin  (FLOMAX ) 0.4 MG CAPS capsule Take 1 capsule (0.4 mg total) by mouth daily. 12/24/22   Hildegard Loge, PA-C    Allergies: Penicillins    Review of Systems  Respiratory:  Positive for cough.     Updated Vital Signs BP (!) 164/88 (BP Location: Left Arm)   Pulse (!) 112   Temp 98.8 F (  37.1 C)   Resp 18   SpO2 99%   Physical Exam Vitals and nursing note reviewed.  Constitutional:      General: She is not in acute distress.    Appearance: She is well-developed. She is not diaphoretic.  HENT:     Head: Normocephalic and atraumatic.     Comments: Swollen turbinates, posterior nasal drip, no noted sinus ttp, tm normal bilaterally.   Eyes:     Pupils: Pupils are equal, round, and reactive to light.  Cardiovascular:     Rate and Rhythm: Normal rate and regular rhythm.     Heart sounds: No murmur heard.    No friction rub. No gallop.  Pulmonary:     Effort: Pulmonary effort is normal.     Breath sounds: No wheezing  or rales.  Abdominal:     General: There is no distension.     Palpations: Abdomen is soft.     Tenderness: There is no abdominal tenderness.  Musculoskeletal:        General: No tenderness.     Cervical back: Normal range of motion and neck supple.  Skin:    General: Skin is warm and dry.  Neurological:     Mental Status: She is alert and oriented to person, place, and time.  Psychiatric:        Behavior: Behavior normal.     (all labs ordered are listed, but only abnormal results are displayed) Labs Reviewed  RESP PANEL BY RT-PCR (RSV, FLU A&B, COVID)  [id number]  GROUP A STREP BY PCR  BASIC METABOLIC PANEL WITH GFR  CBC    EKG: EKG Interpretation Date/Time:  Sunday August 24 2024 17:04:38 EST Ventricular Rate:  100 PR Interval:  124 QRS Duration:  85 QT Interval:  349 QTC Calculation: 451 R Axis:   74  Text Interpretation: Sinus tachycardia No significant change since last tracing Confirmed by Emil Share [phone]) on 08/24/2024 5:06:43 PM  Radiology: No results found.   Procedures   Medications Ordered in the ED - No data to display                                  Medical Decision Making Amount and/or Complexity of Data Reviewed Labs: ordered. Radiology: ordered.  Risk Prescription drug management.   44 yo F with a chief complaints of cough congestion fatigue going on for about a month.  She is well-appearing nontoxic.  Appears well-hydrated.  Clear lung sounds on my exam without tachypnea or hypoxia.  Patient was seen in the triage process and labs were ordered.  This does not seem unreasonable the patient has been sick for a month and is tachycardic on arrival.  This does seem to have resolved by the time of my exam.  Chest x-ray on my independent interpretation without focal infiltrate or pneumothorax.  Lab work without acute anemia no significant electrolyte abnormalities.  Patient continues to not be tachycardic here.  Will discharge the patient  home.  Trial of abx for possible pertussis.  PCP follow up.   6:09 PM:  I have discussed the diagnosis/risks/treatment options with the patient.  Evaluation and diagnostic testing in the emergency department does not suggest an emergent condition requiring admission or immediate intervention beyond what has been performed at this time.  They will follow up with PCP. We also discussed returning to the ED immediately if new or worsening sx occur. We  discussed the sx which are most concerning (e.g., sudden worsening pain, fever, inability to tolerate by mouth) that necessitate immediate return. Medications administered to the patient during their visit and any new prescriptions provided to the patient are listed below.  Medications given during this visit Medications - No data to display   The patient appears reasonably screen and/or stabilized for discharge and I doubt any other medical condition or other [facility] requiring further screening, evaluation, or treatment in the ED at this time prior to discharge.       Final diagnoses:  None    ED Discharge Orders     None          Emil Share, [place] 08/24/24 1832  "

## 2024-08-24 NOTE — Discharge Instructions (Signed)
 Take tylenol 2 pills 4 times a day and motrin 4 pills 3 times a day.  Drink plenty of fluids.  Return for worsening shortness of breath, headache, confusion. Follow up with your family doctor.
